# Patient Record
Sex: Male | Born: 1985 | Race: Black or African American | Hispanic: No | Marital: Single | Smoking: Former smoker
Health system: Southern US, Community
[De-identification: ages and names within clinical notes are randomized; demographics above are authoritative.]

## PROBLEM LIST (undated history)

## (undated) DIAGNOSIS — I10 Essential (primary) hypertension: Secondary | ICD-10-CM

## (undated) DIAGNOSIS — E119 Type 2 diabetes mellitus without complications: Secondary | ICD-10-CM

## (undated) DIAGNOSIS — K859 Acute pancreatitis without necrosis or infection, unspecified: Secondary | ICD-10-CM

---
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
## 2006-04-17 ENCOUNTER — Emergency Department (HOSPITAL_COMMUNITY): Admission: EM | Admit: 2006-04-17 | Discharge: 2006-04-17 | Payer: Self-pay | Admitting: Emergency Medicine

## 2006-05-06 ENCOUNTER — Emergency Department (HOSPITAL_COMMUNITY): Admission: EM | Admit: 2006-05-06 | Discharge: 2006-05-07 | Payer: Self-pay | Admitting: Emergency Medicine

## 2006-10-06 ENCOUNTER — Emergency Department (HOSPITAL_COMMUNITY): Admission: EM | Admit: 2006-10-06 | Discharge: 2006-10-06 | Payer: Self-pay | Admitting: Emergency Medicine

## 2006-10-11 ENCOUNTER — Emergency Department (HOSPITAL_COMMUNITY): Admission: EM | Admit: 2006-10-11 | Discharge: 2006-10-11 | Payer: Self-pay | Admitting: Emergency Medicine

## 2006-11-10 ENCOUNTER — Emergency Department (HOSPITAL_COMMUNITY): Admission: EM | Admit: 2006-11-10 | Discharge: 2006-11-10 | Payer: Self-pay | Admitting: Emergency Medicine

## 2006-11-11 ENCOUNTER — Emergency Department (HOSPITAL_COMMUNITY): Admission: EM | Admit: 2006-11-11 | Discharge: 2006-11-11 | Payer: Self-pay | Admitting: Emergency Medicine

## 2013-05-17 ENCOUNTER — Emergency Department (HOSPITAL_COMMUNITY)
Admission: EM | Admit: 2013-05-17 | Discharge: 2013-05-17 | Disposition: A | Discharge status: Home / Self Care | Payer: Self-pay | Attending: Emergency Medicine | Admitting: Emergency Medicine

## 2013-05-17 ENCOUNTER — Encounter (HOSPITAL_COMMUNITY): Payer: Self-pay | Admitting: Emergency Medicine

## 2013-05-17 DIAGNOSIS — Y939 Activity, unspecified: Secondary | ICD-10-CM | POA: Insufficient documentation

## 2013-05-17 DIAGNOSIS — Z23 Encounter for immunization: Secondary | ICD-10-CM | POA: Insufficient documentation

## 2013-05-17 DIAGNOSIS — S0181XA Laceration without foreign body of other part of head, initial encounter: Secondary | ICD-10-CM

## 2013-05-17 DIAGNOSIS — Y929 Unspecified place or not applicable: Secondary | ICD-10-CM | POA: Insufficient documentation

## 2013-05-17 DIAGNOSIS — X58XXXA Exposure to other specified factors, initial encounter: Secondary | ICD-10-CM | POA: Insufficient documentation

## 2013-05-17 DIAGNOSIS — F17200 Nicotine dependence, unspecified, uncomplicated: Secondary | ICD-10-CM | POA: Insufficient documentation

## 2013-05-17 DIAGNOSIS — S0190XA Unspecified open wound of unspecified part of head, initial encounter: Secondary | ICD-10-CM | POA: Insufficient documentation

## 2013-05-17 MED ORDER — TETANUS-DIPHTH-ACELL PERTUSSIS 5-2.5-18.5 LF-MCG/0.5 IM SUSP
0.5000 mL | Freq: Once | INTRAMUSCULAR | Status: AC
  Administered 2013-05-17: 0.5 mL via INTRAMUSCULAR
  Filled 2013-05-17: qty 0.5
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
## 2013-05-17 NOTE — ED Provider Notes (Signed)
Medical screening examination/treatment/procedure(s) were performed by non-physician practitioner and as supervising physician I was immediately available for consultation/collaboration.  Hurman Horn, MD 05/17/13 [phone]
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
## 2013-05-18 NOTE — ED Provider Notes (Signed)
Medical screening examination/treatment/procedure(s) were performed by non-physician practitioner and as supervising physician I was immediately available for consultation/collaboration.   Hurman Horn, MD 05/18/13 1910

## 2013-05-22 ENCOUNTER — Encounter (HOSPITAL_COMMUNITY): Payer: Self-pay | Admitting: *Deleted

## 2013-05-22 ENCOUNTER — Emergency Department (INDEPENDENT_AMBULATORY_CARE_PROVIDER_SITE_OTHER)
Admission: EM | Admit: 2013-05-22 | Discharge: 2013-05-22 | Disposition: A | Discharge status: Home / Self Care | Payer: Self-pay | Source: Home / Self Care

## 2013-05-22 DIAGNOSIS — Z5189 Encounter for other specified aftercare: Secondary | ICD-10-CM

## 2013-05-22 NOTE — ED Notes (Signed)
Pt  Here  For  Suture  Removal     The  Sutures  Have  Been in  For  5  Days       They  Appear  To be  Well  healing

## 2013-05-24 ENCOUNTER — Emergency Department (INDEPENDENT_AMBULATORY_CARE_PROVIDER_SITE_OTHER)
Admission: EM | Admit: 2013-05-24 | Discharge: 2013-05-24 | Disposition: A | Discharge status: Home / Self Care | Payer: Self-pay | Source: Home / Self Care | Attending: Family Medicine | Admitting: Family Medicine

## 2013-05-24 ENCOUNTER — Encounter (HOSPITAL_COMMUNITY): Payer: Self-pay | Admitting: Emergency Medicine

## 2013-05-24 DIAGNOSIS — Z5189 Encounter for other specified aftercare: Secondary | ICD-10-CM

## 2013-05-24 DIAGNOSIS — Z4802 Encounter for removal of sutures: Secondary | ICD-10-CM
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
## 2013-05-24 NOTE — ED Notes (Signed)
Pt is here to have sutures removed Seen at [facility] ED on 8/1... Pt voices no new concerns. Alert w/no signs of acute distress.

## 2013-05-28 NOTE — ED Provider Notes (Signed)
  CSN: [id number]     Arrival date & time 05/22/13  1105 History     First MD Initiated Contact with Patient 05/22/13 1204     Chief Complaint  Patient presents with  . Suture / Staple Removal   (Consider location/radiation/quality/duration/timing/severity/associated sxs/prior Treatment) HPI  27 yo bm presented for wound inspection and possible suture removal.  Suffered a head laceration about 5 days ago and this was closed in the ED.  Was advised to come in today for recheck.  States that he is doing well.  No drainage, headache, vision changes, fever.   History reviewed. No pertinent past medical history. History reviewed. No pertinent past surgical history. No family history on file. History  Substance Use Topics  . Smoking status: Current Every Day Smoker  . Smokeless tobacco: Not on file  . Alcohol Use: No    Review of Systems  Constitutional: Negative.   HENT: Negative.   Eyes: Negative.   Skin: Positive for wound.  Neurological: Negative.     Allergies  Review of patient's allergies indicates no known allergies.  Home Medications  No current outpatient prescriptions on file. BP 138/101  Pulse 85  Temp(Src) 98.7 F (37.1 C) (Oral)  Resp 18  SpO2 96% Physical Exam  Constitutional: He appears well-developed and well-nourished. No distress.  HENT:  Head: Normocephalic.  Nose: Nose normal.  Forehead sutures intact. Wound healing well but not complete.  No drainage or signs of infection.   Eyes: EOM are normal.    ED Course   Procedures (including critical care time)  Labs Reviewed - No data to display No results found. 1. Visit for wound check     MDM  Advised to come back in a couple of days to give wound a little more healing time.  Voices understanding.  All questions answered.   Zonia Kief, PA-C 05/28/13 1615

## 2013-05-30 NOTE — ED Provider Notes (Signed)
Medical screening examination/treatment/procedure(s) were performed by a resident physician or non-physician practitioner and as the supervising physician I was immediately available for consultation/collaboration.  Clementeen Runkles, MD   Rodolph Bong, MD 05/30/13 [phone]

## 2013-10-08 ENCOUNTER — Emergency Department (HOSPITAL_COMMUNITY)
Admission: EM | Admit: 2013-10-08 | Discharge: 2013-10-08 | Disposition: A | Discharge status: Home / Self Care | Payer: Self-pay | Attending: Emergency Medicine | Admitting: Emergency Medicine

## 2013-10-08 ENCOUNTER — Encounter (HOSPITAL_COMMUNITY): Payer: Self-pay | Admitting: Emergency Medicine

## 2013-10-08 DIAGNOSIS — Z711 Person with feared health complaint in whom no diagnosis is made: Secondary | ICD-10-CM

## 2013-10-08 DIAGNOSIS — R110 Nausea: Secondary | ICD-10-CM | POA: Insufficient documentation

## 2013-10-08 DIAGNOSIS — F17200 Nicotine dependence, unspecified, uncomplicated: Secondary | ICD-10-CM | POA: Insufficient documentation

## 2013-10-08 DIAGNOSIS — R300 Dysuria: Secondary | ICD-10-CM | POA: Insufficient documentation

## 2013-10-08 DIAGNOSIS — K625 Hemorrhage of anus and rectum: Secondary | ICD-10-CM | POA: Insufficient documentation

## 2013-10-08 DIAGNOSIS — R109 Unspecified abdominal pain: Secondary | ICD-10-CM | POA: Insufficient documentation

## 2013-10-08 LAB — CBC
HCT: 45.7 % (ref 39.0–52.0)
Hemoglobin: 15.9 g/dL (ref 13.0–17.0)
MCHC: 34.8 g/dL (ref 30.0–36.0)
MCV: 93.6 fL (ref 78.0–100.0)
Platelets: 284 10*3/uL (ref 150–400)
RDW: 13.8 % (ref 11.5–15.5)

## 2013-10-08 LAB — COMPREHENSIVE METABOLIC PANEL
BUN: 18 mg/dL (ref 6–23)
CO2: 26 mEq/L (ref 19–32)
Chloride: 103 mEq/L (ref 96–112)
GFR calc Af Amer: >90 mL/min (ref >90)
Potassium: 4.8 mEq/L (ref 3.5–5.1)

## 2013-10-08 LAB — URINALYSIS, ROUTINE W REFLEX MICROSCOPIC
Bilirubin Urine: NEGATIVE
Ketones, ur: NEGATIVE mg/dL
Specific Gravity, Urine: 1.023 (ref 1.005–1.030)
Urobilinogen, UA: 0.2 mg/dL (ref 0.0–1.0)
pH: 6.5 (ref 5.0–8.0)

## 2013-10-08 LAB — URINE MICROSCOPIC-ADD ON

## 2013-10-08 MED ORDER — DICYCLOMINE HCL 10 MG PO CAPS: 10.0000 mg | ORAL_CAPSULE | Freq: Three times a day (TID) | ORAL | Status: DC

## 2013-10-08 MED ORDER — ONDANSETRON 4 MG PO TBDP
8.0000 mg | ORAL_TABLET | Freq: Once | ORAL | Status: AC
  Administered 2013-10-08: 8 mg via ORAL
  Filled 2013-10-08: qty 2

## 2013-10-08 MED ORDER — CEFTRIAXONE SODIUM 250 MG IJ SOLR
250.0000 mg | Freq: Once | INTRAMUSCULAR | Status: AC
  Administered 2013-10-08: 250 mg via INTRAMUSCULAR
  Filled 2013-10-08: qty 250

## 2013-10-08 MED ORDER — DICYCLOMINE HCL 10 MG PO CAPS
10.0000 mg | ORAL_CAPSULE | Freq: Once | ORAL | Status: AC
  Administered 2013-10-08: 10 mg via ORAL
  Filled 2013-10-08: qty 1

## 2013-10-08 MED ORDER — LIDOCAINE HCL (PF) 1 % IJ SOLN
INTRAMUSCULAR | Status: AC
  Administered 2013-10-08: 5 mL
  Filled 2013-10-08: qty 5

## 2013-10-08 MED ORDER — AZITHROMYCIN 1 G PO PACK
1.0000 g | PACK | Freq: Once | ORAL | Status: AC
  Administered 2013-10-08: 1 g via ORAL
  Filled 2013-10-08: qty 1

## 2013-10-08 NOTE — ED Notes (Signed)
Pt reports lower abdominal cramping this am and had BM with blood in it

## 2013-10-08 NOTE — ED Provider Notes (Signed)
CSN: [id number]     Arrival date & time 10/08/13  6213 History   First MD Initiated Contact with Patient 10/08/13 1113     Chief Complaint  Patient presents with  . Abdominal Pain  . Rectal Bleeding   (Consider location/radiation/quality/duration/timing/severity/associated sxs/prior Treatment) HPI Comments: Patient is a 27 year old male presented to emergency department for one day of waxing and waning lower abdominal cramping pain w/o radiation w/ one associated loose BM with bright red blood on the toilet paper. The patient states the abdominal pain began last evening around 2AM, but he was able to go to bed. He states the pain was worse this morning when he woke up. He states "curling up in a ball" alleviates pain while he cannot define any precipitating factors. Patient denies any vomiting, but does endorse some nausea. He denies any fevers, testicular or penile pain or swelling, penile discharge. Patient states he has had some dysuria for the last few days and is concerned he may have a sexually transmitted disease. Denies any abdominal surgical history. Does endorse having 2-3 alcoholic beverages last evening. Denies any RD use.   The history is provided by the patient.    History reviewed. No pertinent past medical history. History reviewed. No pertinent past surgical history. No family history on file. History  Substance Use Topics  . Smoking status: Current Every Day Smoker  . Smokeless tobacco: Not on file  . Alcohol Use: Yes     Comment: occ    Review of Systems  Constitutional: Negative for fever and chills.  Respiratory: Negative for shortness of breath.   Cardiovascular: Negative for chest pain.  Gastrointestinal: Positive for nausea, abdominal pain and blood in stool. Negative for vomiting, diarrhea, constipation, abdominal distention, anal bleeding and rectal pain.  Genitourinary: Positive for dysuria. Negative for urgency, decreased urine volume, discharge, penile  swelling, scrotal swelling, penile pain and testicular pain.  Musculoskeletal: Negative for back pain.  All other systems reviewed and are negative.    Allergies  Review of patient's allergies indicates no known allergies.  Home Medications   Current Outpatient Rx  Name  Route  Sig  Dispense  Refill  . dicyclomine (BENTYL) 10 MG capsule   Oral   Take 1 capsule (10 mg total) by mouth 3 (three) times daily before meals.   21 capsule   0    BP 124/78  Pulse 85  Temp(Src) 98 F (36.7 C) (Oral)  Resp 20  Wt 185 lb (83.915 kg)  SpO2 100% Physical Exam  Constitutional: He is oriented to person, place, and time. He appears well-developed and well-nourished. No distress.  HENT:  Head: Normocephalic and atraumatic.  Right Ear: External ear normal.  Left Ear: External ear normal.  Nose: Nose normal.  Mouth/Throat: Oropharynx is clear and moist. No oropharyngeal exudate.  Eyes: Conjunctivae are normal.  Neck: Neck supple.  Cardiovascular: Normal rate, regular rhythm and normal heart sounds.   Pulmonary/Chest: Effort normal and breath sounds normal. No respiratory distress.  Abdominal: Soft. Bowel sounds are normal. He exhibits no distension and no mass. There is tenderness in the right lower quadrant, suprapubic area and left lower quadrant. There is no rigidity, no rebound, no guarding and no CVA tenderness.  Genitourinary: Rectum normal, testes normal and penis normal. Rectal exam shows no external hemorrhoid, no internal hemorrhoid, no fissure, no mass, no tenderness and anal tone normal. Guaiac negative stool. Right testis shows no tenderness. Left testis shows no tenderness. No penile tenderness.  Musculoskeletal:  He exhibits no edema.  Lymphadenopathy:       Right: No inguinal adenopathy present.       Left: No inguinal adenopathy present.  Neurological: He is alert and oriented to person, place, and time.  Skin: Skin is warm and dry. He is not diaphoretic.    ED Course   Procedures (including critical care time) Medications  ondansetron (ZOFRAN-ODT) disintegrating tablet 8 mg (8 mg Oral Given 10/08/13 1223)  dicyclomine (BENTYL) capsule 10 mg (10 mg Oral Given 10/08/13 1223)  azithromycin (ZITHROMAX) powder 1 g (1 g Oral Given 10/08/13 1343)  cefTRIAXone (ROCEPHIN) injection 250 mg (250 mg Intramuscular Given 10/08/13 1345)  lidocaine (PF) (XYLOCAINE) 1 % injection (5 mLs  Given 10/08/13 1345)   Labs Review Labs Reviewed  COMPREHENSIVE METABOLIC PANEL - Abnormal; Notable for the following:    AST 43 (*)    ALT 63 (*)    All other components within normal limits  URINALYSIS, ROUTINE W REFLEX MICROSCOPIC - Abnormal; Notable for the following:    Hgb urine dipstick TRACE (*)    All other components within normal limits  GC/CHLAMYDIA PROBE AMP  CBC  URINE MICROSCOPIC-ADD ON  OCCULT BLOOD, POC DEVICE   Imaging Review No results found.  EKG Interpretation   None       MDM   1. Abdominal pain   2. Concern about STD in male without diagnosis    Afebrile, NAD, non-toxic appearing, AAOx4.   1) Abdominal pain: Patient is nontoxic, nonseptic appearing, in no apparent distress. Abdomen soft, mildly tender on examination, w/o guarding, rebound, or rigidity. Patient's pain and other symptoms adequately managed in emergency department.  Fluids tolerated in ED.  Labs and vitals reviewed.  Patient does not meet the SIRS or Sepsis criteria.  On repeat exam patient does not have a surgical abdomen and there are nor peritoneal signs.  No indication of appendicitis, bowel obstruction, bowel perforation, cholecystitis, diverticulitis.  Patient discharged home with symptomatic treatment and given strict instructions for follow-up with their primary care physician.    2) STD: Patient to be discharged with instructions to follow up with PCP. Pt understands GC/Chlamydia cultures pending and that they will need to inform all sexual partners within the last 6 months if  results return positive. Pt has been treated prophylacticly with azithromycin and rocephin due to pts history. Pt advised that she will receive a call in 48 hours if the test is positive and to refrain from sexual activity for 48 hours. If the test is positive, pt is advised to refrain from sexual activity for 10 days for the medicine to take effect.  Genital examination wnl.  Discussed that because pt has had recent unprotected sex, might want to consider getting tested for HIV as well. Counseled pt that latex condoms are the only way to prevent against STDs or HIV.  I have also discussed reasons to return immediately to the ER.  Patient expresses understanding and agrees with plan. Patient is stable at time of discharge. Patient d/w with Dr. Loretha Stapler, agrees with plan.           Jeannetta Ellis, PA-C 10/08/13 1947

## 2013-10-09 LAB — GC/CHLAMYDIA PROBE AMP
CT Probe RNA: NEGATIVE
GC Probe RNA: NEGATIVE

## 2013-10-11 NOTE — ED Provider Notes (Signed)
Medical screening examination/treatment/procedure(s) were performed by non-physician practitioner and as supervising physician I was immediately available for consultation/collaboration.  EKG Interpretation   None         Candyce Churn, MD 10/11/13 [phone]

## 2016-02-06 ENCOUNTER — Emergency Department (HOSPITAL_COMMUNITY)
Admission: EM | Admit: 2016-02-06 | Discharge: 2016-02-06 | Disposition: A | Discharge status: Home / Self Care | Payer: No Typology Code available for payment source | Attending: Emergency Medicine | Admitting: Emergency Medicine

## 2016-02-06 ENCOUNTER — Encounter (HOSPITAL_COMMUNITY): Payer: Self-pay | Admitting: Family Medicine

## 2016-02-06 ENCOUNTER — Emergency Department (HOSPITAL_COMMUNITY): Payer: No Typology Code available for payment source

## 2016-02-06 DIAGNOSIS — M545 Low back pain, unspecified: Secondary | ICD-10-CM

## 2016-02-06 DIAGNOSIS — Z79899 Other long term (current) drug therapy: Secondary | ICD-10-CM | POA: Diagnosis not present

## 2016-02-06 DIAGNOSIS — Y92410 Unspecified street and highway as the place of occurrence of the external cause: Secondary | ICD-10-CM | POA: Insufficient documentation

## 2016-02-06 DIAGNOSIS — Y998 Other external cause status: Secondary | ICD-10-CM | POA: Insufficient documentation

## 2016-02-06 DIAGNOSIS — S76212A Strain of adductor muscle, fascia and tendon of left thigh, initial encounter: Secondary | ICD-10-CM | POA: Diagnosis not present

## 2016-02-06 DIAGNOSIS — S3992XA Unspecified injury of lower back, initial encounter: Secondary | ICD-10-CM | POA: Diagnosis not present

## 2016-02-06 DIAGNOSIS — F17200 Nicotine dependence, unspecified, uncomplicated: Secondary | ICD-10-CM | POA: Diagnosis not present

## 2016-02-06 DIAGNOSIS — Y9389 Activity, other specified: Secondary | ICD-10-CM | POA: Diagnosis not present

## 2016-02-06 DIAGNOSIS — S76219A Strain of adductor muscle, fascia and tendon of unspecified thigh, initial encounter: Secondary | ICD-10-CM

## 2016-02-06 DIAGNOSIS — Z791 Long term (current) use of non-steroidal anti-inflammatories (NSAID): Secondary | ICD-10-CM | POA: Diagnosis not present

## 2016-02-06 DIAGNOSIS — S199XXA Unspecified injury of neck, initial encounter: Secondary | ICD-10-CM | POA: Diagnosis not present

## 2016-02-06 MED ORDER — NAPROXEN 250 MG PO TABS
500.0000 mg | ORAL_TABLET | Freq: Once | ORAL | Status: AC
  Administered 2016-02-06: 500 mg via ORAL
  Filled 2016-02-06: qty 2

## 2016-02-06 MED ORDER — NAPROXEN 500 MG PO TABS
500.0000 mg | ORAL_TABLET | Freq: Two times a day (BID) | ORAL | Status: DC
Start: 2016-02-06 — End: 2019-04-10

## 2016-02-06 MED ORDER — METHOCARBAMOL 500 MG PO TABS: 500.0000 mg | ORAL_TABLET | Freq: Two times a day (BID) | ORAL | Status: DC | PRN
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
## 2016-02-06 NOTE — ED Notes (Signed)
Declined W/C at D/C and was escorted to lobby by RN. 
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
## 2017-11-10 ENCOUNTER — Encounter (HOSPITAL_COMMUNITY): Payer: Self-pay | Admitting: Emergency Medicine

## 2017-11-10 ENCOUNTER — Emergency Department (HOSPITAL_COMMUNITY)
Admission: EM | Admit: 2017-11-10 | Discharge: 2017-11-10 | Disposition: A | Discharge status: Home / Self Care | Payer: Self-pay | Attending: Emergency Medicine | Admitting: Emergency Medicine

## 2017-11-10 DIAGNOSIS — R112 Nausea with vomiting, unspecified: Secondary | ICD-10-CM | POA: Insufficient documentation

## 2017-11-10 DIAGNOSIS — R6883 Chills (without fever): Secondary | ICD-10-CM | POA: Insufficient documentation

## 2017-11-10 DIAGNOSIS — M7918 Myalgia, other site: Secondary | ICD-10-CM | POA: Insufficient documentation

## 2017-11-10 DIAGNOSIS — Z87891 Personal history of nicotine dependence: Secondary | ICD-10-CM | POA: Insufficient documentation

## 2017-11-10 DIAGNOSIS — R197 Diarrhea, unspecified: Secondary | ICD-10-CM | POA: Insufficient documentation

## 2017-11-10 LAB — URINALYSIS, ROUTINE W REFLEX MICROSCOPIC
Bilirubin Urine: NEGATIVE
Glucose, UA: NEGATIVE mg/dL
HGB URINE DIPSTICK: NEGATIVE
KETONES UR: NEGATIVE mg/dL
Leukocytes, UA: NEGATIVE
Nitrite: NEGATIVE
PH: 6 (ref 5.0–8.0)
Protein, ur: NEGATIVE mg/dL
Specific Gravity, Urine: 1.01 (ref 1.005–1.030)

## 2017-11-10 LAB — COMPREHENSIVE METABOLIC PANEL
ALBUMIN: 4.2 g/dL (ref 3.5–5.0)
ALK PHOS: 58 U/L (ref 38–126)
ALT: 40 U/L (ref 17–63)
ANION GAP: 13 (ref 5–15)
AST: 31 U/L (ref 15–41)
BILIRUBIN TOTAL: 0.6 mg/dL (ref 0.3–1.2)
BUN: 12 mg/dL (ref 6–20)
CALCIUM: 9.6 mg/dL (ref 8.9–10.3)
CO2: 22 mmol/L (ref 22–32)
Chloride: 101 mmol/L (ref 101–111)
Creatinine, Ser: 1.06 mg/dL (ref 0.61–1.24)
GFR calc Af Amer: >60 mL/min (ref >60)
GFR calc non Af Amer: >60 mL/min (ref >60)
GLUCOSE: 108 mg/dL — AB (ref 65–99)
Potassium: 4.5 mmol/L (ref 3.5–5.1)
SODIUM: 136 mmol/L (ref 135–145)
Total Protein: 7.8 g/dL (ref 6.5–8.1)

## 2017-11-10 LAB — LIPASE, BLOOD: Lipase: 31 U/L (ref 11–51)

## 2017-11-10 LAB — CBC
HCT: 46.5 % (ref 39.0–52.0)
HEMOGLOBIN: 15.7 g/dL (ref 13.0–17.0)
MCH: 31 pg (ref 26.0–34.0)
MCHC: 33.8 g/dL (ref 30.0–36.0)
MCV: 91.9 fL (ref 78.0–100.0)
Platelets: 310 10*3/uL (ref 150–400)
RBC: 5.06 MIL/uL (ref 4.22–5.81)
RDW: 14 % (ref 11.5–15.5)
WBC: 7.7 10*3/uL (ref 4.0–10.5)

## 2017-11-10 LAB — INFLUENZA PANEL BY PCR (TYPE A & B)
Influenza A By PCR: NEGATIVE
Influenza B By PCR: NEGATIVE

## 2017-11-10 MED ORDER — DICYCLOMINE HCL 10 MG PO CAPS: 10.0000 mg | ORAL_CAPSULE | Freq: Three times a day (TID) | ORAL | 0 refills | Status: DC

## 2017-11-10 MED ORDER — ONDANSETRON HCL 4 MG PO TABS: 4.0000 mg | ORAL_TABLET | Freq: Three times a day (TID) | ORAL | 0 refills | Status: DC | PRN

## 2017-11-10 NOTE — ED Triage Notes (Signed)
Pt reports diarrhea and vomiting x3 days. Dry cough. Denies fevers
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
## 2017-12-19 ENCOUNTER — Ambulatory Visit (HOSPITAL_COMMUNITY)
Admission: EM | Admit: 2017-12-19 | Discharge: 2017-12-19 | Disposition: A | Discharge status: Home / Self Care | Payer: Self-pay | Attending: Family Medicine | Admitting: Family Medicine

## 2017-12-19 ENCOUNTER — Encounter (HOSPITAL_COMMUNITY): Payer: Self-pay | Admitting: Emergency Medicine

## 2017-12-19 DIAGNOSIS — Z202 Contact with and (suspected) exposure to infections with a predominantly sexual mode of transmission: Secondary | ICD-10-CM | POA: Insufficient documentation

## 2017-12-19 DIAGNOSIS — Z87891 Personal history of nicotine dependence: Secondary | ICD-10-CM | POA: Insufficient documentation

## 2017-12-19 DIAGNOSIS — A630 Anogenital (venereal) warts: Secondary | ICD-10-CM | POA: Insufficient documentation

## 2017-12-19 DIAGNOSIS — Z113 Encounter for screening for infections with a predominantly sexual mode of transmission: Secondary | ICD-10-CM

## 2017-12-19 DIAGNOSIS — Z79899 Other long term (current) drug therapy: Secondary | ICD-10-CM | POA: Insufficient documentation

## 2017-12-19 MED ORDER — AZITHROMYCIN 250 MG PO TABS
ORAL_TABLET | ORAL | Status: AC
  Filled 2017-12-19: qty 4

## 2017-12-19 MED ORDER — CEFTRIAXONE SODIUM 250 MG IJ SOLR
INTRAMUSCULAR | Status: AC
Start: 2017-12-19 — End: 2017-12-19
  Filled 2017-12-19: qty 250

## 2017-12-19 MED ORDER — CEFTRIAXONE SODIUM 250 MG IJ SOLR
250.0000 mg | Freq: Once | INTRAMUSCULAR | Status: AC
  Administered 2017-12-19: 250 mg via INTRAMUSCULAR

## 2017-12-19 MED ORDER — STERILE WATER FOR INJECTION IJ SOLN
INTRAMUSCULAR | Status: AC
  Filled 2017-12-19: qty 10

## 2017-12-19 MED ORDER — AZITHROMYCIN 250 MG PO TABS
1000.0000 mg | ORAL_TABLET | Freq: Once | ORAL | Status: AC
  Administered 2017-12-19: 1000 mg via ORAL
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
## 2017-12-19 NOTE — ED Provider Notes (Signed)
[facility]    CSN: [id number] Arrival date & time: 12/19/17  1017     History   Chief Complaint Chief Complaint  Patient presents with  . Exposure to STD    HPI Edward Ware is a 32 y.o. male.   Noticed a new bump on his distal penile shaft yesterday and would like STD testing. Would like STD treatments as well. Denies positive exposure but does have multiple sexual partners. No penile discharge, dysuria, testicular pain or lesion.        History reviewed. No pertinent past medical history.  There are no active problems to display for this patient.   History reviewed. No pertinent surgical history.     Home Medications    Prior to Admission medications   Medication Sig Start Date End Date Taking? Authorizing Provider  dicyclomine (BENTYL) 10 MG capsule Take 1 capsule (10 mg total) by mouth 3 (three) times daily before meals. 11/10/17   Fayrene Helper, PA-C  methocarbamol (ROBAXIN) 500 MG tablet Take 1 tablet (500 mg total) by mouth 2 (two) times daily as needed for muscle spasms. 02/06/16   Ward, Chase Picket, PA-C  naproxen (NAPROSYN) 500 MG tablet Take 1 tablet (500 mg total) by mouth 2 (two) times daily. 02/06/16   Ward, Chase Picket, PA-C  ondansetron (ZOFRAN) 4 MG tablet Take 1 tablet (4 mg total) by mouth every 8 (eight) hours as needed for nausea or vomiting. 11/10/17   Fayrene Helper, PA-C    Family History History reviewed. No pertinent family history.  Social History Social History   Tobacco Use  . Smoking status: Former Games developer  . Smokeless tobacco: Never Used  Substance Use Topics  . Alcohol use: Yes    Comment: occ  . Drug use: No     Allergies   Patient has no known allergies.   Review of Systems Review of Systems  Constitutional:       See HPI     Physical Exam Triage Vital Signs ED Triage Vitals  Enc Vitals Group     BP 12/19/17 1056 (!) 145/85     Pulse Rate 12/19/17 1056 (!) 103     Resp 12/19/17 1056 18   Temp 12/19/17 1056 97.6 F (36.4 C)     Temp Source 12/19/17 1056 Oral     SpO2 12/19/17 1056 97 %     Weight --      Height --      Head Circumference --      Peak Flow --      Pain Score 12/19/17 1057 3     Pain Loc --      Pain Edu? --      Excl. in GC? --    No data found.  Updated Vital Signs BP (!) 145/85 (BP Location: Right Arm)   Pulse (!) 103   Temp 97.6 F (36.4 C) (Oral)   Resp 18   SpO2 97%   Visual Acuity Right Eye Distance:   Left Eye Distance:   Bilateral Distance:    Right Eye Near:   Left Eye Near:    Bilateral Near:     Physical Exam  Constitutional: He is oriented to person, place, and time. He appears well-developed and well-nourished.  Cardiovascular: Normal rate.  Pulmonary/Chest: Effort normal.  Genitourinary:  Genitourinary Comments: Circumcised, has soft elevated clustered and single papules of different size and shape on the distal shaft of penis. Some appears to be cauliflower-like appearance. No erosion  noted. No discharge noted. Testicles unremarkable.   Neurological: He is alert and oriented to person, place, and time.  Nursing note and vitals reviewed.    UC Treatments / Results  Labs (all labs ordered are listed, but only abnormal results are displayed) Labs Reviewed  URINE CYTOLOGY ANCILLARY ONLY    EKG  EKG Interpretation None       Radiology No results found.  Procedures Procedures (including critical care time)  Medications Ordered in UC Medications  azithromycin (ZITHROMAX) tablet 1,000 mg (not administered)  cefTRIAXone (ROCEPHIN) injection 250 mg (not administered)     Initial Impression / Assessment and Plan / UC Course  I have reviewed the triage vital signs and the nursing notes.  Pertinent labs & imaging results that were available during my care of the patient were reviewed by me and considered in my medical decision making (see chart for details).    Final Clinical Impressions(s) / UC Diagnoses     Final diagnoses:  Genital warts  Screen for STD (sexually transmitted disease)   Lesions on the penile shaft classic for genital warts. Advised to get in with a PCP or Health Department to seek treatment.   Urine cytology obtained today for STD testing. Treated empirically with azithromycin and rocephin.     ED Discharge Orders    None     Controlled Substance Prescriptions [place] Controlled Substance Registry consulted? No   Lucia EstelleZheng, Usman Millett, NP 12/19/17 1119

## 2017-12-20 LAB — URINE CYTOLOGY ANCILLARY ONLY
Chlamydia: NEGATIVE
Neisseria Gonorrhea: NEGATIVE
Trichomonas: NEGATIVE

## 2019-04-09 ENCOUNTER — Other Ambulatory Visit: Payer: Self-pay

## 2019-04-09 ENCOUNTER — Emergency Department (HOSPITAL_COMMUNITY)
Admission: EM | Admit: 2019-04-09 | Discharge: 2019-04-10 | Disposition: A | Discharge status: Home / Self Care | Payer: No Typology Code available for payment source | Attending: Emergency Medicine | Admitting: Emergency Medicine

## 2019-04-09 ENCOUNTER — Emergency Department (HOSPITAL_COMMUNITY): Payer: No Typology Code available for payment source

## 2019-04-09 DIAGNOSIS — Z87891 Personal history of nicotine dependence: Secondary | ICD-10-CM | POA: Diagnosis not present

## 2019-04-09 DIAGNOSIS — M25561 Pain in right knee: Secondary | ICD-10-CM | POA: Diagnosis present
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
## 2019-04-10 MED ORDER — NAPROXEN 500 MG PO TABS: 500.0000 mg | ORAL_TABLET | Freq: Two times a day (BID) | ORAL | 0 refills | Status: DC

## 2019-04-10 MED ORDER — TRAMADOL HCL 50 MG PO TABS
50.0000 mg | ORAL_TABLET | Freq: Once | ORAL | Status: AC
  Administered 2019-04-10: 01:00:00 50 mg via ORAL
  Filled 2019-04-10: qty 1

## 2019-04-10 MED ORDER — NAPROXEN 500 MG PO TABS
500.0000 mg | ORAL_TABLET | Freq: Once | ORAL | Status: AC
  Administered 2019-04-10: 500 mg via ORAL
  Filled 2019-04-10: qty 1
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
## 2019-04-10 NOTE — Discharge Instructions (Addendum)
Apply ice to areas of injury 3-4 times per day to limit inflammation.  We recommend consistent use of naproxen for pain.  We recommend follow-up with an Orthopedist or a primary care doctor to ensure resolution of symptoms.  Return to the ED for any new or concerning symptoms.
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
## 2021-06-03 ENCOUNTER — Observation Stay (HOSPITAL_COMMUNITY): Payer: Self-pay

## 2021-06-03 ENCOUNTER — Inpatient Hospital Stay (HOSPITAL_COMMUNITY)
Admission: EM | Admit: 2021-06-03 | Discharge: 2021-06-08 | DRG: 637 | Disposition: A | Discharge status: Home / Self Care | Payer: Self-pay | Attending: Family Medicine | Admitting: Family Medicine

## 2021-06-03 ENCOUNTER — Encounter (HOSPITAL_COMMUNITY): Payer: Self-pay

## 2021-06-03 ENCOUNTER — Other Ambulatory Visit: Payer: Self-pay

## 2021-06-03 DIAGNOSIS — R109 Unspecified abdominal pain: Secondary | ICD-10-CM

## 2021-06-03 DIAGNOSIS — Z20822 Contact with and (suspected) exposure to covid-19: Secondary | ICD-10-CM | POA: Diagnosis present

## 2021-06-03 DIAGNOSIS — F1010 Alcohol abuse, uncomplicated: Secondary | ICD-10-CM

## 2021-06-03 DIAGNOSIS — F10239 Alcohol dependence with withdrawal, unspecified: Secondary | ICD-10-CM | POA: Diagnosis present

## 2021-06-03 DIAGNOSIS — E785 Hyperlipidemia, unspecified: Secondary | ICD-10-CM | POA: Diagnosis present

## 2021-06-03 DIAGNOSIS — Z833 Family history of diabetes mellitus: Secondary | ICD-10-CM

## 2021-06-03 DIAGNOSIS — Z79899 Other long term (current) drug therapy: Secondary | ICD-10-CM

## 2021-06-03 DIAGNOSIS — Z6832 Body mass index (BMI) 32.0-32.9, adult: Secondary | ICD-10-CM

## 2021-06-03 DIAGNOSIS — E860 Dehydration: Secondary | ICD-10-CM

## 2021-06-03 DIAGNOSIS — K859 Acute pancreatitis without necrosis or infection, unspecified: Secondary | ICD-10-CM | POA: Diagnosis present

## 2021-06-03 DIAGNOSIS — K852 Alcohol induced acute pancreatitis without necrosis or infection: Secondary | ICD-10-CM

## 2021-06-03 DIAGNOSIS — Z720 Tobacco use: Secondary | ICD-10-CM

## 2021-06-03 DIAGNOSIS — Z28310 Unvaccinated for covid-19: Secondary | ICD-10-CM

## 2021-06-03 DIAGNOSIS — I10 Essential (primary) hypertension: Secondary | ICD-10-CM | POA: Diagnosis present

## 2021-06-03 DIAGNOSIS — E1110 Type 2 diabetes mellitus with ketoacidosis without coma: Principal | ICD-10-CM

## 2021-06-03 LAB — URINALYSIS, ROUTINE W REFLEX MICROSCOPIC
Bilirubin Urine: NEGATIVE
Glucose, UA: >=500 mg/dL — AB
Ketones, ur: >80 mg/dL — AB
Leukocytes,Ua: NEGATIVE
Nitrite: NEGATIVE
Protein, ur: NEGATIVE mg/dL
Specific Gravity, Urine: 1.01 (ref 1.005–1.030)
pH: 5.5 (ref 5.0–8.0)

## 2021-06-03 LAB — CBG MONITORING, ED
Glucose-Capillary: 306 mg/dL — ABNORMAL HIGH (ref 70–99)
Glucose-Capillary: 406 mg/dL — ABNORMAL HIGH (ref 70–99)
Glucose-Capillary: 411 mg/dL — ABNORMAL HIGH (ref 70–99)
Glucose-Capillary: 542 mg/dL (ref 70–99)
Glucose-Capillary: >600 mg/dL (ref 70–99)

## 2021-06-03 LAB — CBC WITH DIFFERENTIAL/PLATELET
Abs Immature Granulocytes: 0.04 10*3/uL (ref 0.00–0.07)
Basophils Absolute: 0 10*3/uL (ref 0.0–0.1)
Basophils Relative: 0 %
Eosinophils Absolute: 0.1 10*3/uL (ref 0.0–0.5)
Eosinophils Relative: 1 %
HCT: 51.9 % (ref 39.0–52.0)
Hemoglobin: 17.7 g/dL — ABNORMAL HIGH (ref 13.0–17.0)
Immature Granulocytes: 1 %
Lymphocytes Relative: 13 %
Lymphs Abs: 1.1 10*3/uL (ref 0.7–4.0)
MCH: 30.3 pg (ref 26.0–34.0)
MCHC: 34.1 g/dL (ref 30.0–36.0)
MCV: 88.9 fL (ref 80.0–100.0)
Monocytes Absolute: 0.7 10*3/uL (ref 0.1–1.0)
Monocytes Relative: 9 %
Neutro Abs: 6.3 10*3/uL (ref 1.7–7.7)
Neutrophils Relative %: 76 %
Platelets: 265 10*3/uL (ref 150–400)
RBC: 5.84 MIL/uL — ABNORMAL HIGH (ref 4.22–5.81)
RDW: 13.1 % (ref 11.5–15.5)
WBC: 8.2 10*3/uL (ref 4.0–10.5)
nRBC: 0 % (ref 0.0–0.2)

## 2021-06-03 LAB — I-STAT CHEM 8, ED
BUN: 21 mg/dL — ABNORMAL HIGH (ref 6–20)
Calcium, Ion: 1.12 mmol/L — ABNORMAL LOW (ref 1.15–1.40)
Chloride: 91 mmol/L — ABNORMAL LOW (ref 98–111)
Creatinine, Ser: 1.1 mg/dL (ref 0.61–1.24)
Glucose, Bld: >700 mg/dL (ref 70–99)
HCT: 56 % — ABNORMAL HIGH (ref 39.0–52.0)
Hemoglobin: 19 g/dL — ABNORMAL HIGH (ref 13.0–17.0)
Potassium: 5.2 mmol/L — ABNORMAL HIGH (ref 3.5–5.1)
Sodium: 126 mmol/L — ABNORMAL LOW (ref 135–145)
TCO2: 22 mmol/L (ref 22–32)

## 2021-06-03 LAB — URINALYSIS, MICROSCOPIC (REFLEX)
Bacteria, UA: NONE SEEN
Squamous Epithelial / HPF: NONE SEEN (ref 0–5)

## 2021-06-03 LAB — COMPREHENSIVE METABOLIC PANEL
ALT: 52 U/L — ABNORMAL HIGH (ref 0–44)
AST: 35 U/L (ref 15–41)
Albumin: 4.8 g/dL (ref 3.5–5.0)
Alkaline Phosphatase: 82 U/L (ref 38–126)
Anion gap: 21 — ABNORMAL HIGH (ref 5–15)
BUN: 18 mg/dL (ref 6–20)
CO2: 21 mmol/L — ABNORMAL LOW (ref 22–32)
Calcium: 9.8 mg/dL (ref 8.9–10.3)
Chloride: 88 mmol/L — ABNORMAL LOW (ref 98–111)
Creatinine, Ser: 1.36 mg/dL — ABNORMAL HIGH (ref 0.61–1.24)
GFR, Estimated: >60 mL/min (ref >60)
Glucose, Bld: 698 mg/dL (ref 70–99)
Potassium: 5.2 mmol/L — ABNORMAL HIGH (ref 3.5–5.1)
Sodium: 130 mmol/L — ABNORMAL LOW (ref 135–145)
Total Bilirubin: 1.4 mg/dL — ABNORMAL HIGH (ref 0.3–1.2)
Total Protein: 9 g/dL — ABNORMAL HIGH (ref 6.5–8.1)

## 2021-06-03 LAB — BASIC METABOLIC PANEL
Anion gap: 20 — ABNORMAL HIGH (ref 5–15)
BUN: 16 mg/dL (ref 6–20)
CO2: 16 mmol/L — ABNORMAL LOW (ref 22–32)
Calcium: 9.7 mg/dL (ref 8.9–10.3)
Chloride: 94 mmol/L — ABNORMAL LOW (ref 98–111)
Creatinine, Ser: 1.27 mg/dL — ABNORMAL HIGH (ref 0.61–1.24)
GFR, Estimated: >60 mL/min (ref >60)
Glucose, Bld: 383 mg/dL — ABNORMAL HIGH (ref 70–99)
Potassium: 4.9 mmol/L (ref 3.5–5.1)
Sodium: 130 mmol/L — ABNORMAL LOW (ref 135–145)

## 2021-06-03 LAB — MAGNESIUM: Magnesium: 2.5 mg/dL — ABNORMAL HIGH (ref 1.7–2.4)

## 2021-06-03 LAB — CK: Total CK: 234 U/L (ref 49–397)

## 2021-06-03 LAB — BLOOD GAS, VENOUS
Acid-base deficit: 5.6 mmol/L — ABNORMAL HIGH (ref 0.0–2.0)
Bicarbonate: 20.6 mmol/L (ref 20.0–28.0)
O2 Saturation: 80.8 %
Patient temperature: 98.6
pCO2, Ven: 44.2 mmHg (ref 44.0–60.0)
pH, Ven: 7.291 (ref 7.250–7.430)
pO2, Ven: 53.6 mmHg — ABNORMAL HIGH (ref 32.0–45.0)

## 2021-06-03 LAB — BETA-HYDROXYBUTYRIC ACID: Beta-Hydroxybutyric Acid: 4.99 mmol/L — ABNORMAL HIGH (ref 0.05–0.27)

## 2021-06-03 LAB — RESP PANEL BY RT-PCR (FLU A&B, COVID) ARPGX2
Influenza A by PCR: NEGATIVE
Influenza B by PCR: NEGATIVE
SARS Coronavirus 2 by RT PCR: NEGATIVE

## 2021-06-03 LAB — PHOSPHORUS: Phosphorus: 3.9 mg/dL (ref 2.5–4.6)

## 2021-06-03 LAB — TROPONIN I (HIGH SENSITIVITY)
Troponin I (High Sensitivity): 7 ng/L (ref <18)
Troponin I (High Sensitivity): 15 ng/L (ref <18)

## 2021-06-03 LAB — LACTIC ACID, PLASMA: Lactic Acid, Venous: 2.4 mmol/L (ref 0.5–1.9)

## 2021-06-03 LAB — LIPASE, BLOOD: Lipase: 891 U/L — ABNORMAL HIGH (ref 11–51)

## 2021-06-03 MED ORDER — IOHEXOL 350 MG/ML SOLN
80.0000 mL | Freq: Once | INTRAVENOUS | Status: AC | PRN
  Administered 2021-06-03: 80 mL via INTRAVENOUS

## 2021-06-03 MED ORDER — FENTANYL CITRATE (PF) 100 MCG/2ML IJ SOLN
50.0000 ug | Freq: Once | INTRAMUSCULAR | Status: AC
Start: 2021-06-03 — End: 2021-06-03
  Administered 2021-06-03: 50 ug via INTRAVENOUS
  Filled 2021-06-03: qty 2

## 2021-06-03 MED ORDER — HYDROMORPHONE HCL 1 MG/ML IJ SOLN
0.5000 mg | INTRAMUSCULAR | Status: DC | PRN
  Administered 2021-06-03 – 2021-06-06 (×10): 0.5 mg via INTRAVENOUS
  Filled 2021-06-03 (×10): qty 1

## 2021-06-03 MED ORDER — FOLIC ACID 1 MG PO TABS
1.0000 mg | ORAL_TABLET | Freq: Every day | ORAL | Status: DC
  Administered 2021-06-06 – 2021-06-08 (×3): 1 mg via ORAL
  Filled 2021-06-03 (×4): qty 1

## 2021-06-03 MED ORDER — ADULT MULTIVITAMIN W/MINERALS CH
1.0000 | ORAL_TABLET | Freq: Every day | ORAL | Status: DC
  Administered 2021-06-06 – 2021-06-08 (×3): 1 via ORAL
  Filled 2021-06-03 (×4): qty 1

## 2021-06-03 MED ORDER — LACTATED RINGERS IV BOLUS
20.0000 mL/kg | Freq: Once | INTRAVENOUS | Status: AC
  Administered 2021-06-03: 1000 mL via INTRAVENOUS

## 2021-06-03 MED ORDER — THIAMINE HCL 100 MG PO TABS
100.0000 mg | ORAL_TABLET | Freq: Every day | ORAL | Status: DC
  Administered 2021-06-06 – 2021-06-08 (×3): 100 mg via ORAL
  Filled 2021-06-03 (×4): qty 1

## 2021-06-03 MED ORDER — LACTATED RINGERS IV SOLN: INTRAVENOUS | Status: DC

## 2021-06-03 MED ORDER — LORAZEPAM 1 MG PO TABS
1.0000 mg | ORAL_TABLET | ORAL | Status: AC | PRN
  Administered 2021-06-04 – 2021-06-05 (×2): 1 mg via ORAL
  Filled 2021-06-03 (×2): qty 1

## 2021-06-03 MED ORDER — THIAMINE HCL 100 MG/ML IJ SOLN
100.0000 mg | Freq: Every day | INTRAMUSCULAR | Status: DC
  Administered 2021-06-04 – 2021-06-05 (×2): 100 mg via INTRAVENOUS
  Filled 2021-06-03 (×2): qty 2

## 2021-06-03 MED ORDER — DEXTROSE 50 % IV SOLN: 0.0000 mL | INTRAVENOUS | Status: DC | PRN

## 2021-06-03 MED ORDER — LORAZEPAM 2 MG/ML IJ SOLN
1.0000 mg | INTRAMUSCULAR | Status: AC | PRN
  Administered 2021-06-04 (×5): 2 mg via INTRAVENOUS
  Filled 2021-06-03 (×5): qty 1

## 2021-06-03 MED ORDER — INSULIN REGULAR(HUMAN) IN NACL 100-0.9 UT/100ML-% IV SOLN
INTRAVENOUS | Status: DC
  Administered 2021-06-03: 15 [IU]/h via INTRAVENOUS
  Administered 2021-06-04: 13 [IU]/h via INTRAVENOUS
  Administered 2021-06-04: 22 [IU]/h via INTRAVENOUS
  Filled 2021-06-03 (×3): qty 100

## 2021-06-03 MED ORDER — DEXTROSE IN LACTATED RINGERS 5 % IV SOLN: INTRAVENOUS | Status: DC

## 2021-06-03 NOTE — ED Provider Notes (Signed)
[facility]-EMERGENCY DEPT Provider Note   CSN: [id number] Arrival date & time: 06/03/21  1812     History Chief Complaint  Patient presents with   Hyperglycemia   Abdominal Pain    Trevaun A Sleeth is a 35 y.o. male here presenting with hyperglycemia.  Patient states that for the last 3 to 4 days, he has been having diffuse abdominal pain.  He states that he has been nauseated.  He states that he has been very thirsty and urinating a lot.  He states that he felt like he was dehydrated.  He has been drinking alcohol and also sweet tea.  Patient went to urgent care and has a glucose greater than 600.  Patient has no known history of diabetes.   The history is provided by the patient.      History reviewed. No pertinent past medical history.  There are no problems to display for this patient.   History reviewed. No pertinent surgical history.     History reviewed. No pertinent family history.  Social History   Tobacco Use   Smoking status: Former   Smokeless tobacco: Never  Substance Use Topics   Alcohol use: Yes    Comment: 1 pint a night of alcohol   Drug use: No    Home Medications Prior to Admission medications   Medication Sig Start Date End Date Taking? Authorizing Provider  dicyclomine (BENTYL) 10 MG capsule Take 1 capsule (10 mg total) by mouth 3 (three) times daily before meals. 11/10/17   Fayrene Helper, PA-C  methocarbamol (ROBAXIN) 500 MG tablet Take 1 tablet (500 mg total) by mouth 2 (two) times daily as needed for muscle spasms. 02/06/16   Ward, Chase Picket, PA-C  naproxen (NAPROSYN) 500 MG tablet Take 1 tablet (500 mg total) by mouth 2 (two) times daily. 04/10/19   Antony Madura, PA-C  ondansetron (ZOFRAN) 4 MG tablet Take 1 tablet (4 mg total) by mouth every 8 (eight) hours as needed for nausea or vomiting. 11/10/17   Fayrene Helper, PA-C    Allergies    Patient has no known allergies.  Review of Systems   Review of Systems   Gastrointestinal:  Positive for abdominal pain.  All other systems reviewed and are negative.  Physical Exam Updated Vital Signs BP (!) 158/98   Pulse 78   Temp 97.7 F (36.5 C) (Axillary)   Resp (!) 22   Ht 5\' 6"  (1.676 m)   Wt 91 kg   SpO2 98%   BMI 32.38 kg/m   Physical Exam Vitals and nursing note reviewed.  Constitutional:      Appearance: He is well-developed.  HENT:     Head: Normocephalic.     Mouth/Throat:     Mouth: Mucous membranes are moist.  Eyes:     Extraocular Movements: Extraocular movements intact.  Cardiovascular:     Rate and Rhythm: Normal rate and regular rhythm.  Abdominal:     General: Abdomen is flat.  Skin:    General: Skin is warm.     Capillary Refill: Capillary refill takes less than 2 seconds.  Neurological:     General: No focal deficit present.     Mental Status: He is alert and oriented to person, place, and time.  Psychiatric:        Mood and Affect: Mood normal.        Behavior: Behavior normal.    ED Results / Procedures / Treatments   Labs (all labs ordered  are listed, but only abnormal results are displayed) Labs Reviewed  CBC WITH DIFFERENTIAL/PLATELET - Abnormal; Notable for the following components:      Result Value   RBC 5.84 (*)    Hemoglobin 17.7 (*)    All other components within normal limits  COMPREHENSIVE METABOLIC PANEL - Abnormal; Notable for the following components:   Sodium 130 (*)    Potassium 5.2 (*)    Chloride 88 (*)    CO2 21 (*)    Glucose, Bld 698 (*)    Creatinine, Ser 1.36 (*)    Total Protein 9.0 (*)    ALT 52 (*)    Total Bilirubin 1.4 (*)    Anion gap 21 (*)    All other components within normal limits  URINALYSIS, ROUTINE W REFLEX MICROSCOPIC - Abnormal; Notable for the following components:   Glucose, UA >=500 (*)    Hgb urine dipstick TRACE (*)    Ketones, ur >80 (*)    All other components within normal limits  BLOOD GAS, VENOUS - Abnormal; Notable for the following components:    pO2, Ven 53.6 (*)    Acid-base deficit 5.6 (*)    All other components within normal limits  CBG MONITORING, ED - Abnormal; Notable for the following components:   Glucose-Capillary >600 (*)    All other components within normal limits  I-STAT CHEM 8, ED - Abnormal; Notable for the following components:   Sodium 126 (*)    Potassium 5.2 (*)    Chloride 91 (*)    BUN 21 (*)    Glucose, Bld >700 (*)    Calcium, Ion 1.12 (*)    Hemoglobin 19.0 (*)    HCT 56.0 (*)    All other components within normal limits  RESP PANEL BY RT-PCR (FLU A&B, COVID) ARPGX2  URINALYSIS, MICROSCOPIC (REFLEX)  BETA-HYDROXYBUTYRIC ACID  BETA-HYDROXYBUTYRIC ACID  LIPASE, BLOOD  CBG MONITORING, ED    EKG None  Radiology No results found.  Procedures Procedures   CRITICAL CARE Performed by: Richardean Canal   Total critical care time: 30 minutes  Critical care time was exclusive of separately billable procedures and treating other patients.  Critical care was necessary to treat or prevent imminent or life-threatening deterioration.  Critical care was time spent personally by me on the following activities: development of treatment plan with patient and/or surrogate as well as nursing, discussions with consultants, evaluation of patient's response to treatment, examination of patient, obtaining history from patient or surrogate, ordering and performing treatments and interventions, ordering and review of laboratory studies, ordering and review of radiographic studies, pulse oximetry and re-evaluation of patient's condition.   Medications Ordered in ED Medications  insulin regular, human (MYXREDLIN) 100 units/ 100 mL infusion (15 Units/hr Intravenous New Bag/Given 06/03/21 2016)  lactated ringers infusion ( Intravenous New Bag/Given 06/03/21 2020)  dextrose 5 % in lactated ringers infusion (0 mLs Intravenous Hold 06/03/21 2003)  dextrose 50 % solution 0-50 mL (has no administration in time range)   lactated ringers bolus 20 mL/kg (1,000 mLs Intravenous New Bag/Given 06/03/21 1844)  fentaNYL (SUBLIMAZE) injection 50 mcg (50 mcg Intravenous Given 06/03/21 2012)    ED Course  I have reviewed the triage vital signs and the nursing notes.  Pertinent labs & imaging results that were available during my care of the patient were reviewed by me and considered in my medical decision making (see chart for details).    MDM Rules/Calculators/A&P  Elzie A Mullens is a 35 y.o. male here presenting with increased thirstiness and abdominal pain.  Concern for possible new onset diabetes and DKA versus HHS.  Will hydrate patient.  Will start insulin drip.  Will get DKA labs  8:24 PM Labs showed anion gap of 21 and glucose of 698.  Patient has greater than 80 ketones in his urine.  Patient is started on IV insulin.  Will admit for DKA.   Final Clinical Impression(s) / ED Diagnoses Final diagnoses:  None    Rx / DC Orders ED Discharge Orders     None        Charlynne Pander, MD 06/03/21 2025

## 2021-06-03 NOTE — H&P (Signed)
Edward Ware JHE:[id number] DOB: 07-24-1986 DOA: 06/03/2021     PCP: Patient, No Pcp Per (Inactive)   Outpatient Specialists:  NONE    Patient arrived to ER on 06/03/21 at 1812 Referred by Attending Therisa Doyne, MD   Patient coming from: home Lives  With family    Chief Complaint:    Chief Complaint  Patient presents with   Hyperglycemia   Abdominal Pain    HPI: Edward Ware is a 35 y.o. male with medical history significant of  obesity, tobacco abuse, alcohol abuse    Presented with   abdominal pain for the past 2 days  Associated with nausea Have been extra thirsty and urinating a lot for the past 5 days Stopped drinking 2 days ago NO CP, no cough    Patient reports he drinks about a pint a day and has been drinking for past 2 years He has quit in the past and denies history of alcohol withdrawal no seizures no hallucinations no shakes. Patient states his last EtOH was 48 hours ago and he is not experiencing any tremors currently  Has  NOt been vaccinated against COVID      Initial COVID TEST  in house  PCR testing  Pending  No results found for: SARSCOV2NAA   Regarding pertinent Chronic problems:        Morbid obesity-   BMI Readings from Last 1 Encounters:  06/03/21 32.38 kg/m    While in ER: Labs worrisome for DKA ketones in urine blood sugars above 698 Started on insulin drip Lipase obtained but currently pending   ED Triage Vitals  Enc Vitals Group     BP 06/03/21 1826 (!) 148/97     Pulse Rate 06/03/21 1826 87     Resp 06/03/21 1832 16     Temp 06/03/21 1826 97.7 F (36.5 C)     Temp Source 06/03/21 1826 Axillary     SpO2 06/03/21 1826 97 %     Weight 06/03/21 1832 200 lb 9.9 oz (91 kg)     Height 06/03/21 1832 5\' 6"  (1.676 m)     Head Circumference --      Peak Flow --      Pain Score 06/03/21 1827 9     Pain Loc --      Pain Edu? --      Excl. in GC? --   TMAX(24)@      _________________________________________ Significant initial  Findings: Abnormal Labs Reviewed  CBC WITH DIFFERENTIAL/PLATELET - Abnormal; Notable for the following components:      Result Value   RBC 5.84 (*)    Hemoglobin 17.7 (*)    All other components within normal limits  COMPREHENSIVE METABOLIC PANEL - Abnormal; Notable for the following components:   Sodium 130 (*)    Potassium 5.2 (*)    Chloride 88 (*)    CO2 21 (*)    Glucose, Bld 698 (*)    Creatinine, Ser 1.36 (*)    Total Protein 9.0 (*)    ALT 52 (*)    Total Bilirubin 1.4 (*)    Anion gap 21 (*)    All other components within normal limits  URINALYSIS, ROUTINE W REFLEX MICROSCOPIC - Abnormal; Notable for the following components:   Glucose, UA >=500 (*)    Hgb urine dipstick TRACE (*)    Ketones, ur >80 (*)    All other components within normal limits  BLOOD GAS, VENOUS - Abnormal; Notable  for the following components:   pO2, Ven 53.6 (*)    Acid-base deficit 5.6 (*)    All other components within normal limits  LIPASE, BLOOD - Abnormal; Notable for the following components:   Lipase 891 (*)    All other components within normal limits  CBG MONITORING, ED - Abnormal; Notable for the following components:   Glucose-Capillary >600 (*)    All other components within normal limits  I-STAT CHEM 8, ED - Abnormal; Notable for the following components:   Sodium 126 (*)    Potassium 5.2 (*)    Chloride 91 (*)    BUN 21 (*)    Glucose, Bld >700 (*)    Calcium, Ion 1.12 (*)    Hemoglobin 19.0 (*)    HCT 56.0 (*)    All other components within normal limits  CBG MONITORING, ED - Abnormal; Notable for the following components:   Glucose-Capillary 542 (*)    All other components within normal limits   ____________________________________________  CXR -  NON acute  CTabd/pelvis - showing pancreatitis and hepatic statosis    _________________________ Troponin  ordered ECG: Ordered Personally reviewed by me  showing: HR : 108 Rhythm:   Sinus tachycardia   nonspecific changes, repolarization abnormalities QTC 444 _______________  The recent clinical data is shown below. Vitals:   06/03/21 1826 06/03/21 1832 06/03/21 1900 06/03/21 1930  BP: (!) 148/97  (!) 159/89 (!) 158/98  Pulse: 87  90 78  Resp:  16 18 (!) 22  Temp: 97.7 F (36.5 C)     TempSrc: Axillary     SpO2: 97%  97% 98%  Weight:  91 kg    Height:  5\' 6"  (1.676 m)            Component Value Date/Time   WBC 8.2 06/03/2021 1837   LYMPHSABS 1.1 06/03/2021 1837   MONOABS 0.7 06/03/2021 1837   EOSABS 0.1 06/03/2021 1837   BASOSABS 0.0 06/03/2021 1837      Lactic Acid, Venous ordered No results found for: LATICACIDVEN      UA   evidence of UTI       Urine analysis:    Component Value Date/Time   COLORURINE YELLOW 06/03/2021 1837   APPEARANCEUR CLEAR 06/03/2021 1837   LABSPEC 1.010 06/03/2021 1837   PHURINE 5.5 06/03/2021 1837   GLUCOSEU >=500 (A) 06/03/2021 1837   HGBUR TRACE (A) 06/03/2021 1837   BILIRUBINUR NEGATIVE 06/03/2021 1837   KETONESUR >80 (A) 06/03/2021 1837   PROTEINUR NEGATIVE 06/03/2021 1837   UROBILINOGEN 0.2 10/08/2013 1230   NITRITE NEGATIVE 06/03/2021 1837   LEUKOCYTESUR NEGATIVE 06/03/2021 1837    Results for orders placed or performed during the hospital encounter of 10/08/13  GC/Chlamydia Probe Amp     Status: None   Collection Time: 10/08/13 11:40 AM   Specimen: Genital  Result Value Ref Range Status   CT Probe RNA NEGATIVE NEGATIVE Final   GC Probe RNA NEGATIVE NEGATIVE Final    Comment: (NOTE)                                                                                       **  Normal Reference Range: Negative**      Assay performed using the Gen-Probe APTIMA COMBO2 (R) Assay. Acceptable specimen types for this assay include APTIMA Swabs (Unisex, endocervical, urethral, or vaginal), first void urine, and ThinPrep liquid based cytology samples. Performed at [facility]      _______________________________________________ Hospitalist was called for admission for DKA/HHS and pancreatitis  The following Work up has been ordered so far:  Orders Placed This Encounter  Procedures   Resp Panel by RT-PCR (Flu A&B, Covid) Nasopharyngeal Swab   CBC with Differential/Platelet   Comprehensive metabolic panel   Beta-hydroxybutyric acid   Urinalysis, Routine w reflex microscopic   Blood gas, venous   Lipase, blood   Urinalysis, Microscopic (reflex)   Diet NPO time specified   Cardiac monitoring   Initiate Carrier Fluid Protocol   Notify physician (specify)   If present, discontinue Insulin Pump after IV Insulin is initiated.   Do NOT use lab glucose values in [employer].  If CBG meter reads "Critical High", enter 600.   IV insulin infusion with sufficient glucose should be continued until MD determines acidosis is corrected and places transition orders.   Upon IV fluid bolus completion, place order for STAT BMET (LAB15) and call provider with results.   Height and weight   Cardiac monitoring   Consult to hospitalist   Pulse oximetry, continuous   CBG monitoring, ED   I-stat chem 8, ED (not at [facility] or [facility])   CBG monitoring, ED   CBG monitoring, ED   Insert peripheral IV   Place in observation (patient's expected length of stay will be less than 2 midnights)    Following Medications were ordered in ER: Medications  insulin regular, human (MYXREDLIN) 100 units/ 100 mL infusion (15 Units/hr Intravenous New Bag/Given 06/03/21 2016)  lactated ringers infusion ( Intravenous New Bag/Given 06/03/21 2020)  dextrose 5 % in lactated ringers infusion (0 mLs Intravenous Hold 06/03/21 2003)  dextrose 50 % solution 0-50 mL (has no administration in time range)  lactated ringers bolus 20 mL/kg (1,000 mLs Intravenous New Bag/Given 06/03/21 1844)  fentaNYL (SUBLIMAZE) injection 50 mcg (50 mcg Intravenous Given 06/03/21 2012)        Consult Orders  (From admission, onward)            Start     Ordered   06/03/21 2021  Consult to hospitalist  Once       Provider:  (Not yet assigned)  Question Answer Comment  Place call to: [facility]   Reason for Consult Admit      06/03/21 2020              OTHER Significant initial  Findings:  labs showing:    Recent Labs  Lab 06/03/21 1837 06/03/21 1851  NA 130* 126*  K 5.2* 5.2*  CO2 21*  --   GLUCOSE 698* >700*  BUN 18 21*  CREATININE 1.36* 1.10  CALCIUM 9.8  --     Cr   stable,  Lab Results  Component Value Date   CREATININE 1.10 06/03/2021   CREATININE 1.36 (H) 06/03/2021   CREATININE 1.06 11/10/2017    Recent Labs  Lab 06/03/21 1837  AST 35  ALT 52*  ALKPHOS 82  BILITOT 1.4*  PROT 9.0*  ALBUMIN 4.8   Lab Results  Component Value Date   CALCIUM 9.8 06/03/2021    Plt: Lab Results  Component Value Date   PLT 265 06/03/2021     Venous  Blood  Gas result:  pH 7.291  pCO2 44.2       Recent Labs  Lab 06/03/21 1837 06/03/21 1851  WBC 8.2  --   NEUTROABS 6.3  --   HGB 17.7* 19.0*  HCT 51.9 56.0*  MCV 88.9  --   PLT 265  --     HG/HCT    Up from baseline see below    Component Value Date/Time   HGB 19.0 (H) 06/03/2021 1851   HCT 56.0 (H) 06/03/2021 1851   MCV 88.9 06/03/2021 1837      Recent Labs  Lab 06/03/21 1837  LIPASE 891*       DM  labs:  HbA1C: No results for input(s): HGBA1C in the last 8760 hours.     CBG (last 3)  Recent Labs    06/03/21 1823  GLUCAP >600*        Cultures: No results found for: SDES, SPECREQUEST, CULT, REPTSTATUS   Radiological Exams on Admission: CT ABDOMEN PELVIS WO CONTRAST  Result Date: 06/03/2021 CLINICAL DATA:  Diffuse abdominal pain with abdominal distension. Nausea. EXAM: CT ABDOMEN AND PELVIS WITHOUT CONTRAST TECHNIQUE: Multidetector CT imaging of the abdomen and pelvis was performed following the standard protocol without IV contrast. COMPARISON:  None. FINDINGS: Lower chest: The lung bases are clear.   The heart is normal in size. Hepatobiliary: Advanced hepatic steatosis with diffusely decreased hepatic density with areas of focal fatty sparing. The liver is enlarged spanning 20.2 cm cranial caudal. There is no obvious focal hepatic lesion on this unenhanced exam. Possible sludge in the gallbladder without calcified stone or pericholecystic inflammation. No biliary dilatation. Pancreas: Peripancreatic fat stranding with enlargement of the pancreatic head and uncinate process. Ill-defined stranding and fluid tracks into the right retroperitoneum in the anterior pararenal space. No pancreatic ductal dilatation. No acute peripancreatic collection. No parenchymal air. Spleen: Normal in size without focal abnormality. Adrenals/Urinary Tract: Normal adrenal glands. No hydronephrosis or renal calculi. No perinephric edema. No evidence of focal renal abnormality on this unenhanced exam. Partially distended urinary bladder Stomach/Bowel: Ingested material within the stomach. Mild wall thickening of the duodenum is felt to be reactive. Remainder of the small bowel is decompressed. Normal appendix. Small to moderate colonic stool burden. Vascular/Lymphatic: Normal caliber abdominal aorta. No aortic atherosclerosis. No portal venous or mesenteric gas. No abdominopelvic adenopathy. Reproductive: Prostate is unremarkable. Other: Peripancreatic fat stranding and inflammation with ill-defined free fluid tracking in the right retroperitoneum and anterior pararenal space. No organized collection or free air no abdominal wall hernia. Musculoskeletal: There are no acute or suspicious osseous abnormalities. There is a tiny vertebral body hemangioma within L4. Peripherally sclerotic lesion in the left inferior pubic ramus has a benign appearance. IMPRESSION: 1. Acute edematous pancreatitis. No acute peripancreatic collection or evidence of pancreatic necrosis on this unenhanced exam. 2. Advanced hepatic steatosis and hepatomegaly.  Electronically Signed   By: Narda Rutherford M.D.   On: 06/03/2021 21:36   DG CHEST PORT 1 VIEW  Result Date: 06/03/2021 CLINICAL DATA:  DKA, upper abdominal pain EXAM: PORTABLE CHEST 1 VIEW COMPARISON:  None. FINDINGS: Cardiomediastinal silhouette is within normal limits. There is no focal airspace disease. There is no large pleural effusion or visible pneumothorax. There is no acute osseous abnormality. IMPRESSION: No evidence of acute cardiopulmonary disease. Electronically Signed   By: Caprice Renshaw M.D.   On: 06/03/2021 21:21   _______________________________________________________________________________________________________ Latest  Blood pressure (!) 158/98, pulse 78, temperature 97.7 F (36.5 C), temperature source Axillary, resp. rate Marland Kitchen)  22, height  (1.676 m), weight 91 kg, SpO2 98 %.   Review of Systems:    Pertinent positives include:  fatigue, abdominal pain, nausea Polyuria/polydipsia  Constitutional:  No weight loss, night sweats, Fevers, chills,  weight loss  HEENT:  No headaches, Difficulty swallowing,Tooth/dental problems,Sore throat,  No sneezing, itching, ear ache, nasal congestion, post nasal drip,  Cardio-vascular:  No chest pain, Orthopnea, PND, anasarca, dizziness, palpitations.no Bilateral lower extremity swelling  GI:  No heartburn, indigestion,, vomiting, diarrhea, change in bowel habits, loss of appetite, melena, blood in stool, hematemesis Resp:  no shortness of breath at rest. No dyspnea on exertion, No excess mucus, no productive cough, No non-productive cough, No coughing up of blood.No change in color of mucus.No wheezing. Skin:  no rash or lesions. No jaundice GU:  no dysuria, change in color of urine, no urgency or frequency. No straining to urinate.  No flank pain.  Musculoskeletal:  No joint pain or no joint swelling. No decreased range of motion. No back pain.  Psych:  No change in mood or affect. No depression or anxiety. No memory loss.   Neuro: no localizing neurological complaints, no tingling, no weakness, no double vision, no gait abnormality, no slurred speech, no confusion  All systems reviewed and apart from HOPI all are negative _______________________________________________________________________________________________ Past Medical History:  History reviewed. No pertinent past medical history.    History reviewed. No pertinent surgical history.  Social History:  Ambulatory   independently      reports that he has quit smoking. He has never used smokeless tobacco. He reports current alcohol use. He reports that he does not use drugs.     Family History:   Family History  Problem Relation Age of Onset   Diabetes Mother    Hypertension Mother    Diabetes Other    ______________________________________________________________________________________________ Allergies: No Known Allergies   Prior to Admission medications   Medication Sig Start Date End Date Taking? Authorizing Provider  dicyclomine (BENTYL) 10 MG capsule Take 1 capsule (10 mg total) by mouth 3 (three) times daily before meals. 11/10/17   Fayrene Helper, PA-C  methocarbamol (ROBAXIN) 500 MG tablet Take 1 tablet (500 mg total) by mouth 2 (two) times daily as needed for muscle spasms. 02/06/16   Ward, Chase Picket, PA-C  naproxen (NAPROSYN) 500 MG tablet Take 1 tablet (500 mg total) by mouth 2 (two) times daily. 04/10/19   Antony Madura, PA-C  ondansetron (ZOFRAN) 4 MG tablet Take 1 tablet (4 mg total) by mouth every 8 (eight) hours as needed for nausea or vomiting. 11/10/17   Fayrene Helper, PA-C    ___________________________________________________________________________________________________ Physical Exam: Vitals with BMI 06/03/2021 06/03/2021 06/03/2021  Height - -   Weight - - 200 lbs 10 oz  BMI - - 32.4  Systolic 158 159 -  Diastolic 98 89 -  Pulse 78 90 -     1. General:  in No  Acute distress   Chronically ill  -appearing 2. Psychological: Alert and   Oriented 3. Head/ENT: Dry Mucous Membranes                          Head Non traumatic, neck supple                          Normal  Dentition 4. SKIN:   decreased Skin turgor,  Skin clean Dry and intact no rash 5. Heart:  Regular rate and rhythm no  Murmur, no Rub or gallop 6. Lungs:  Clear to auscultation bilaterally, no wheezes or crackles   7. Abdomen: Soft, epigastric tenderness, Non distended   obese  bowel sounds diminished 8. Lower extremities: no clubbing, cyanosis, no  edema 9. Neurologically Grossly intact, moving all 4 extremities equally  no tremor 10. MSK: Normal range of motion    Chart has been reviewed  ______________________________________________________________________________________________  Assessment/Plan 35 y.o. male with medical history significant of  obesity, tobacco abuse, alcohol abuse  Admitted for  DKA/HHS and pancreatitis  Present on Admission:  DKA /HHS(diabetic ketoacidosis) (HCC)  - will admit per DKA/ HHS protocol, obtain serial BMET, start on glucosestabalizer, aggressive IVF.   Change IVF to D5 1/2Na after BG <250 expect prolonged NPO state due to pancreatitis  So far work up of possible causes of DKA/HSS with CXR, ECG one set of cardiac enzymes, UA.    Most likely cause new onset DM and pancreatitis Monitor in Stepdown. Replace potassium as needed.    Consult diabetes coordinator Likely new DM 2 onset will need diabetic education prior to DC   Pancreatitis - most likely cause being alcohol induced,   CT of abdomen showing acute pancreatitis  no evidence of pseudocyst Will rehydrate with aggressive IV fluids Keep n.p.o. Follow clinically  - strongly advised patient to stop drinking alcohol -Check lipid panel  Control pain with IV pain medications If no significant improvement in the next 24 hours may benefit from GI consult      ETOH abuse - order CIWA protocol, motitor for any signs of  withdraw Counseled regarding importance of quitting   Tobacco abuse -  - Spoke about importance of quitting spent 5 minutes discussing options for treatment, prior attempts at quitting, and dangers of smoking  - pt declined nicotine patch   - nursing tobacco cessation protocol    Dehydration - will rehydrate   Other plan as per orders.  DVT prophylaxis:  SCD     Code Status:    Code Status: Not on file FULL CODE   as per patient   I had personally discussed CODE STATUS with patient     Family Communication:   Family   at  Bedside  plan of care was discussed  with   mother  Disposition Plan:     To home once workup is complete and patient is stable   Following barriers for discharge:                            Electrolytes corrected                                Pain controlled with PO medications                                be able to tolerate PO                            Will likely need home health, home O2, set up                         Diabetes care coordinator  Transition of care consulted                                 Consults called: none  Admission status:  ED Disposition     ED Disposition  Admit   Condition  --   Comment  Hospital [facility]: [facility] [[id number]]  Level of Care: Stepdown [14]  Admit to SDU based on following criteria: Hemodynamic compromise or significant risk of instability:  Patient requiring short term acute titration and management of vasoactive drips, and invasive monitoring (i.e., CVP and Arterial line).  May place patient in observation at [facility] or [facility] if equivalent level of care is available:: No  Covid Evaluation: Asymptomatic Screening Protocol (No Symptoms)  Diagnosis: DKA (diabetic ketoacidosis) (HCC) [696295]  Admitting Physician: Therisa Doyne [[id number]]  Attending Physician: Therisa Doyne [[id number]]           Obs        SDU tele indefinitely please  discontinue once patient no longer qualifies COVID-19 Labs    No results found for: SARSCOV2NAA   Precautions: admitted as  asymptomatic screening protocol   PPE: Used by the provider:   N95  eye Goggles,  Gloves     Anibal Quinby 06/03/2021, 9:48 PM    [facility]     after 2 AM please page floor coverage PA If 7AM-7PM, please contact the day team taking care of the patient using [email or web address]   Patient was evaluated in the context of the global COVID-19 pandemic, which necessitated consideration that the patient might be at risk for infection with the SARS-CoV-2 virus that causes COVID-19. Institutional protocols and algorithms that pertain to the evaluation of patients at risk for COVID-19 are in a state of rapid change based on information released by regulatory bodies including the CDC and federal and state organizations. These policies and algorithms were followed during the patient's care.

## 2021-06-03 NOTE — ED Notes (Signed)
Patient transported to CT 

## 2021-06-03 NOTE — ED Notes (Signed)
RN asked this Clinical research associate to retake pt's CBG

## 2021-06-03 NOTE — ED Triage Notes (Signed)
Pt came in POV from urgent care c/o increased urination, thirst for 5 days along with abdominal pain since yesterday. Patient blood sugar was to high to read at the urgent care and patient was referred here. Patient tried Aleve without relief for abdominal pain.

## 2021-06-03 NOTE — ED Notes (Signed)
Lactic acid 2.4  MD notified

## 2021-06-04 LAB — GLUCOSE, CAPILLARY
Glucose-Capillary: 232 mg/dL — ABNORMAL HIGH (ref 70–99)
Glucose-Capillary: 216 mg/dL — ABNORMAL HIGH (ref 70–99)
Glucose-Capillary: 210 mg/dL — ABNORMAL HIGH (ref 70–99)
Glucose-Capillary: 243 mg/dL — ABNORMAL HIGH (ref 70–99)
Glucose-Capillary: 252 mg/dL — ABNORMAL HIGH (ref 70–99)
Glucose-Capillary: 318 mg/dL — ABNORMAL HIGH (ref 70–99)
Glucose-Capillary: 332 mg/dL — ABNORMAL HIGH (ref 70–99)
Glucose-Capillary: 377 mg/dL — ABNORMAL HIGH (ref 70–99)
Glucose-Capillary: 299 mg/dL — ABNORMAL HIGH (ref 70–99)
Glucose-Capillary: 270 mg/dL — ABNORMAL HIGH (ref 70–99)
Glucose-Capillary: 249 mg/dL — ABNORMAL HIGH (ref 70–99)
Glucose-Capillary: 198 mg/dL — ABNORMAL HIGH (ref 70–99)
Glucose-Capillary: 222 mg/dL — ABNORMAL HIGH (ref 70–99)
Glucose-Capillary: 236 mg/dL — ABNORMAL HIGH (ref 70–99)
Glucose-Capillary: 225 mg/dL — ABNORMAL HIGH (ref 70–99)
Glucose-Capillary: 282 mg/dL — ABNORMAL HIGH (ref 70–99)
Glucose-Capillary: 329 mg/dL — ABNORMAL HIGH (ref 70–99)
Glucose-Capillary: 310 mg/dL — ABNORMAL HIGH (ref 70–99)

## 2021-06-04 LAB — COMPREHENSIVE METABOLIC PANEL
ALT: 48 U/L — ABNORMAL HIGH (ref 0–44)
AST: 33 U/L (ref 15–41)
Albumin: 4.6 g/dL (ref 3.5–5.0)
Alkaline Phosphatase: 80 U/L (ref 38–126)
Anion gap: 15 (ref 5–15)
BUN: 16 mg/dL (ref 6–20)
CO2: 21 mmol/L — ABNORMAL LOW (ref 22–32)
Calcium: 9.6 mg/dL (ref 8.9–10.3)
Chloride: 96 mmol/L — ABNORMAL LOW (ref 98–111)
Creatinine, Ser: 0.95 mg/dL (ref 0.61–1.24)
GFR, Estimated: >60 mL/min (ref >60)
Glucose, Bld: 208 mg/dL — ABNORMAL HIGH (ref 70–99)
Potassium: 3.7 mmol/L (ref 3.5–5.1)
Sodium: 132 mmol/L — ABNORMAL LOW (ref 135–145)
Total Bilirubin: 1.2 mg/dL (ref 0.3–1.2)
Total Protein: 8.6 g/dL — ABNORMAL HIGH (ref 6.5–8.1)

## 2021-06-04 LAB — BASIC METABOLIC PANEL
Anion gap: 13 (ref 5–15)
Anion gap: 15 (ref 5–15)
Anion gap: 13 (ref 5–15)
Anion gap: 13 (ref 5–15)
BUN: 15 mg/dL (ref 6–20)
BUN: 17 mg/dL (ref 6–20)
BUN: 17 mg/dL (ref 6–20)
BUN: 16 mg/dL (ref 6–20)
CO2: 22 mmol/L (ref 22–32)
CO2: 21 mmol/L — ABNORMAL LOW (ref 22–32)
CO2: 21 mmol/L — ABNORMAL LOW (ref 22–32)
CO2: 23 mmol/L (ref 22–32)
Calcium: 9.7 mg/dL (ref 8.9–10.3)
Calcium: 9.7 mg/dL (ref 8.9–10.3)
Calcium: 9.9 mg/dL (ref 8.9–10.3)
Calcium: 9.3 mg/dL (ref 8.9–10.3)
Chloride: 97 mmol/L — ABNORMAL LOW (ref 98–111)
Chloride: 97 mmol/L — ABNORMAL LOW (ref 98–111)
Chloride: 101 mmol/L (ref 98–111)
Chloride: 96 mmol/L — ABNORMAL LOW (ref 98–111)
Creatinine, Ser: 1.03 mg/dL (ref 0.61–1.24)
Creatinine, Ser: 1.07 mg/dL (ref 0.61–1.24)
Creatinine, Ser: 0.98 mg/dL (ref 0.61–1.24)
Creatinine, Ser: 1.09 mg/dL (ref 0.61–1.24)
GFR, Estimated: >60 mL/min (ref >60)
GFR, Estimated: >60 mL/min (ref >60)
GFR, Estimated: >60 mL/min (ref >60)
GFR, Estimated: >60 mL/min (ref >60)
Glucose, Bld: 205 mg/dL — ABNORMAL HIGH (ref 70–99)
Glucose, Bld: 210 mg/dL — ABNORMAL HIGH (ref 70–99)
Glucose, Bld: 179 mg/dL — ABNORMAL HIGH (ref 70–99)
Glucose, Bld: 240 mg/dL — ABNORMAL HIGH (ref 70–99)
Potassium: 3.8 mmol/L (ref 3.5–5.1)
Potassium: 4 mmol/L (ref 3.5–5.1)
Potassium: 3.7 mmol/L (ref 3.5–5.1)
Potassium: 4.1 mmol/L (ref 3.5–5.1)
Sodium: 132 mmol/L — ABNORMAL LOW (ref 135–145)
Sodium: 133 mmol/L — ABNORMAL LOW (ref 135–145)
Sodium: 135 mmol/L (ref 135–145)
Sodium: 132 mmol/L — ABNORMAL LOW (ref 135–145)

## 2021-06-04 LAB — RAPID URINE DRUG SCREEN, HOSP PERFORMED
Amphetamines: NOT DETECTED
Barbiturates: NOT DETECTED
Benzodiazepines: NOT DETECTED
Cocaine: NOT DETECTED
Opiates: NOT DETECTED
Tetrahydrocannabinol: NOT DETECTED

## 2021-06-04 LAB — HEMOGLOBIN A1C
Hgb A1c MFr Bld: 12.3 % — ABNORMAL HIGH (ref 4.8–5.6)
Mean Plasma Glucose: 306.31 mg/dL

## 2021-06-04 LAB — LIPID PANEL
Cholesterol: 269 mg/dL — ABNORMAL HIGH (ref 0–200)
HDL: 35 mg/dL — ABNORMAL LOW (ref >40)
LDL Cholesterol: 193 mg/dL — ABNORMAL HIGH (ref 0–99)
Total CHOL/HDL Ratio: 7.7 RATIO
Triglycerides: 204 mg/dL — ABNORMAL HIGH (ref <150)
VLDL: 41 mg/dL — ABNORMAL HIGH (ref 0–40)

## 2021-06-04 LAB — LACTIC ACID, PLASMA
Lactic Acid, Venous: 1.4 mmol/L (ref 0.5–1.9)
Lactic Acid, Venous: 1.3 mmol/L (ref 0.5–1.9)

## 2021-06-04 LAB — HIV ANTIBODY (ROUTINE TESTING W REFLEX): HIV Screen 4th Generation wRfx: NONREACTIVE

## 2021-06-04 LAB — LIPASE, BLOOD: Lipase: 1340 U/L — ABNORMAL HIGH (ref 11–51)

## 2021-06-04 LAB — BETA-HYDROXYBUTYRIC ACID
Beta-Hydroxybutyric Acid: 2.6 mmol/L — ABNORMAL HIGH (ref 0.05–0.27)
Beta-Hydroxybutyric Acid: 1.98 mmol/L — ABNORMAL HIGH (ref 0.05–0.27)

## 2021-06-04 LAB — TSH: TSH: 1.021 u[IU]/mL (ref 0.350–4.500)

## 2021-06-04 LAB — MRSA NEXT GEN BY PCR, NASAL: MRSA by PCR Next Gen: NOT DETECTED

## 2021-06-04 LAB — OSMOLALITY: Osmolality: 292 mOsm/kg (ref 275–295)

## 2021-06-04 MED ORDER — INSULIN ASPART 100 UNIT/ML IJ SOLN
0.0000 [IU] | Freq: Three times a day (TID) | INTRAMUSCULAR | Status: DC
Start: 2021-06-04 — End: 2021-06-08
  Administered 2021-06-04 – 2021-06-05 (×3): 5 [IU] via SUBCUTANEOUS
  Administered 2021-06-05: 7 [IU] via SUBCUTANEOUS
  Administered 2021-06-06 – 2021-06-07 (×5): 3 [IU] via SUBCUTANEOUS
  Administered 2021-06-07 – 2021-06-08 (×3): 2 [IU] via SUBCUTANEOUS

## 2021-06-04 MED ORDER — INSULIN STARTER KIT- PEN NEEDLES (ENGLISH): 1.0000 | Freq: Once | Status: DC

## 2021-06-04 MED ORDER — DEXTROSE IN LACTATED RINGERS 5 % IV SOLN: INTRAVENOUS | Status: DC

## 2021-06-04 MED ORDER — INSULIN GLARGINE-YFGN 100 UNIT/ML ~~LOC~~ SOLN
20.0000 [IU] | Freq: Every day | SUBCUTANEOUS | Status: DC
  Administered 2021-06-04: 20 [IU] via SUBCUTANEOUS
  Filled 2021-06-04 (×2): qty 0.2

## 2021-06-04 MED ORDER — ENOXAPARIN SODIUM 40 MG/0.4ML IJ SOSY
40.0000 mg | PREFILLED_SYRINGE | INTRAMUSCULAR | Status: DC
  Administered 2021-06-04 – 2021-06-07 (×4): 40 mg via SUBCUTANEOUS
  Filled 2021-06-04 (×4): qty 0.4

## 2021-06-04 MED ORDER — INSULIN STARTER KIT- PEN NEEDLES (ENGLISH)
1.0000 | Freq: Once | Status: AC
  Administered 2021-06-04: 1
  Filled 2021-06-04: qty 1

## 2021-06-04 MED ORDER — LIP MEDEX EX OINT
TOPICAL_OINTMENT | CUTANEOUS | Status: AC
  Administered 2021-06-04: 1
  Filled 2021-06-04: qty 7

## 2021-06-04 MED ORDER — LIVING WELL WITH DIABETES BOOK
Freq: Once | Status: AC
  Administered 2021-06-04: 1
  Filled 2021-06-04: qty 1

## 2021-06-04 MED ORDER — CHLORHEXIDINE GLUCONATE CLOTH 2 % EX PADS
6.0000 | MEDICATED_PAD | Freq: Every day | CUTANEOUS | Status: DC
  Administered 2021-06-04 – 2021-06-08 (×5): 6 via TOPICAL

## 2021-06-04 MED ORDER — LACTATED RINGERS IV SOLN: INTRAVENOUS | Status: DC

## 2021-06-04 MED ORDER — INSULIN ASPART 100 UNIT/ML IJ SOLN
0.0000 [IU] | Freq: Every day | INTRAMUSCULAR | Status: DC
  Administered 2021-06-04: 4 [IU] via SUBCUTANEOUS
  Administered 2021-06-05: 2 [IU] via SUBCUTANEOUS
  Administered 2021-06-06: 3 [IU] via SUBCUTANEOUS
  Administered 2021-06-07: 2 [IU] via SUBCUTANEOUS

## 2021-06-04 NOTE — Progress Notes (Signed)
NUTRITION NOTE  Consult received for education for patient with new dx of DM. Diet advanced from NPO to CLD today at 1325.   Patient laying in bed with no family or visitors present. Able to talk with RN and DM Coordinator at which time RN shares that patient's mentation is such that education is not appropriate at this time.  Communicated to RN and DM Coordinator that RD will place handouts in Discharge Instructions. RD is unable to enter referral for outpatient education due to patient not having insurance.   Will monitor for appropriateness for education prior to d/c.      Trenton Gammon, MS, RD, LDN, CNSC Inpatient Clinical Dietitian RD pager # available in AMION  After hours/weekend pager # available in [facility]

## 2021-06-04 NOTE — TOC Initial Note (Signed)
Transition of Care [facility]) - Initial/Assessment Note    Patient Details  Name: Edward Ware MRN: [id number] Date of Birth: 1986/10/15  Transition of Care [facility]) CM/SW Contact:    Golda Acre, RN Phone Number: 06/04/2021, 7:46 AM  Clinical Narrative:                 35 y.o. male with medical history significant of  obesity, tobacco abuse, alcohol abuse     Presented with   abdominal pain for the past 2 days  Associated with nausea Have been extra thirsty and urinating a lot for the past 5 days Stopped drinking 2 days ago NO CP, no cough    Patient reports he drinks about a pint a day and has been drinking for past 2 years He has quit in the past and denies history of alcohol withdrawal no seizures no hallucinations no shakes. Patient states his last EtOH was 48 hours ago and he is not experiencing any tremors currently   Has  NOt been vaccinated against COVID       Initial COVID TEST  in house  PCR testing  Pending-test is negative TOC PLAN OF CARE:  following for toc needs will need diabetic education by the coordinator Progression,iv insulin protocol. Bld glucose-210, anion gap -15, elevated lidpid panel. Expected Discharge Plan: Home/Self Care Barriers to Discharge: Continued Medical Work up   Patient Goals and CMS Choice        Expected Discharge Plan and Services Expected Discharge Plan: Home/Self Care   Discharge Planning Services: CM Consult   Living arrangements for the past 2 months: Single Family Home                                      Prior Living Arrangements/Services Living arrangements for the past 2 months: Single Family Home Lives with:: Self Patient language and need for interpreter reviewed:: Yes Do you feel safe going back to the place where you live?: Yes            Criminal Activity/Legal Involvement Pertinent to Current Situation/Hospitalization: No - Comment as needed  Activities of Daily Living Home Assistive  Devices/Equipment: None ADL Screening (condition at time of admission) Patient's cognitive ability adequate to safely complete daily activities?: Yes Is the patient deaf or have difficulty hearing?: No Does the patient have difficulty seeing, even when wearing glasses/contacts?: No Does the patient have difficulty concentrating, remembering, or making decisions?: No Patient able to express need for assistance with ADLs?: Yes Does the patient have difficulty dressing or bathing?: No Independently performs ADLs?: Yes (appropriate for developmental age) Does the patient have difficulty walking or climbing stairs?: No Weakness of Legs: Both Weakness of Arms/Hands: Both  Permission Sought/Granted                  Emotional Assessment Appearance:: Appears stated age     Orientation: : Oriented to Self, Oriented to Place, Oriented to  Time, Oriented to Situation Alcohol / Substance Use: Not Applicable Psych Involvement: No (comment)  Admission diagnosis:  DKA (diabetic ketoacidosis) (HCC) [E11.10] Diabetic ketoacidosis without coma associated with type 2 diabetes mellitus (HCC) [E11.10] Patient Active Problem List   Diagnosis Date Noted   DKA (diabetic ketoacidosis) (HCC) 06/03/2021   ETOH abuse 06/03/2021   Tobacco abuse 06/03/2021   Pancreatitis 06/03/2021   Dehydration 06/03/2021   PCP:  Patient, No Pcp Per (Inactive)  Pharmacy:   [facility] [phone] - [place], [place] - [address] AT [facility] OF [address] [place] [address] Phone: [phone] Fax: [phone]  [id number] - [place] - [address] AT [facility] [address]  [place] [address] Phone: [phone] Fax: [phone]     Social Determinants of Health (SDOH) Interventions    Readmission Risk Interventions No flowsheet data found.

## 2021-06-04 NOTE — Progress Notes (Signed)
Inpatient Diabetes Program Recommendations  AACE/ADA: New Consensus Statement on Inpatient Glycemic Control (2015)  Target Ranges:  Prepandial:   less than 140 mg/dL      Peak postprandial:   less than 180 mg/dL (1-2 hours)      Critically ill patients:  140 - 180 mg/dL   Lab Results  Component Value Date   GLUCAP 198 (H) 06/04/2021   HGBA1C 12.3 (H) 06/04/2021    Attempted to speak with patient at bedside and begin diabetes education.  He is not appropriate at this time for teaching per his RN.  Left ReliOn glucometer in room with belongings.  Will educate once appropriate.    Will continue to follow while inpatient.  Thank you, Dulce Sellar, RN, BSN Diabetes Coordinator Inpatient Diabetes Program [phone] (team pager from 8a-5p)

## 2021-06-04 NOTE — Discharge Instructions (Signed)

## 2021-06-04 NOTE — Progress Notes (Signed)
Inpatient Diabetes Program Recommendations  AACE/ADA: New Consensus Statement on Inpatient Glycemic Control (2015)  Target Ranges:  Prepandial:   less than 140 mg/dL      Peak postprandial:   less than 180 mg/dL (1-2 hours)      Critically ill patients:  140 - 180 mg/dL   Lab Results  Component Value Date   GLUCAP 299 (H) 06/04/2021   HGBA1C 12.3 (H) 06/04/2021    Review of Glycemic Control  Diabetes history: None Outpatient Diabetes medications: None Current orders for Inpatient glycemic control: IV insulin per Endo[employer] for DKA  New-onset DM with HgbA1C of 12.3%  Ordered Living Well with Diabetes book, Insulin pen starter kit and RD consult. Also ordered [facility] consult for assistance with obtaining PCP and medications as pt is not insured.  Inpatient Diabetes Program Recommendations:    Continue IV insulin until criteria met for discontinuation. Give Semglee 2H prior to discontinuation of drip. Consider: Lantus 15 units Q24H Novolog 0-9 units Q4H x 12H, then TID with meals and 0-5 HS Will likely need meal coverage insulin when eating CHO-mod diet - Novolog 4 units TID if eating > 50%.  Will teach Survival Skills and insulin pen administration prior to discharge.  Thank you. Lorenda Peck, RD, LDN, CDE Inpatient Diabetes Coordinator [phone]

## 2021-06-04 NOTE — Progress Notes (Signed)
[facility]  PROGRESS NOTE  Edward Ware WVP:[id number] DOB: Feb 09, 1986 DOA: 06/03/2021 PCP: Patient, No Pcp Per (Inactive)   Brief HPI:   35 year old male with history of obesity, tobacco abuse, alcohol abuse presented with abdominal pain for 2 days.  Lab work showed pancreatitis, confirmed on CT scan abdomen.  Lipase was elevated at 891, went up to 1340.  Patient also found to be in DKA, started on IV insulin.  Also started on CIWA protocol for alcohol withdrawal.    Subjective   Patient seen, drowsy this morning after he received Ativan as per CIWA protocol.   Assessment/Plan:   Diabetic ketoacidosis -Presented with DKA, blood glucose greater than 700; anion gap 21 -Started on IV insulin per DKA protocol -At this time CBG has been less than 250 g/dL since 5 AM this morning -Anion gap is 13; CO2 21 -We will discontinue IV insulin, start clear liquid diet and Lantus 20 subcu daily -We will start sliding scale insulin with NovoLog, check CBG every 4 hours -Continue to monitor BMP as ordered  Acute pancreatitis -Secondary to alcohol abuse -Lipid profile showed cholesterol 269, HDL 35, LDL 193, triglyceride 204 -CT abdomen/pelvis shows acute edematous pancreatitis; advanced hepatic steatosis and hepatomegaly -Lipase was initially at 891; went up to 1340 -Called and discussed with GI, Dr. Elnoria Howard, who says okay to start clear liquid diet. -Continue LR at 125 mill per hour for next 24 hours -Follow lipase in a.m.   Alcohol abuse -Started on Ativan per CIWA protocol -Continue thiamine, folate -Once more stable, will need social work consult for info for outpatient rehab   Scheduled medications:    Chlorhexidine Gluconate Cloth  6 each Topical Daily   folic acid  1 mg Oral Daily   insulin glargine-yfgn  20 Units Subcutaneous Daily   multivitamin with minerals  1 tablet Oral Daily   thiamine  100 mg Oral Daily   Or   thiamine  100 mg Intravenous Daily          Data Reviewed:   CBG:  Recent Labs  Lab 06/04/21 0845 06/04/21 0944 06/04/21 1043 06/04/21 1144 06/04/21 1242  GLUCAP 377* 299* 270* 249* 198*    SpO2: 93 %    Vitals:   06/04/21 0600 06/04/21 0700 06/04/21 0742 06/04/21 1248  BP: 119/79 (!) 147/88 130/90   Pulse: (!) 108 (!) 104 97   Resp: 20 (!) 21 15   Temp:   98 F (36.7 C) 97.8 F (36.6 C)  TempSrc:   Oral Oral  SpO2: 94% 94% 93%   Weight:      Height:         Intake/Output Summary (Last 24 hours) at 06/04/2021 1327 Last data filed at 06/04/2021 0744 Gross per 24 hour  Intake 2786.45 ml  Output 400 ml  Net 2386.45 ml    08/17 1901 - 08/19 0700 In: 2256.1 [I.V.:990.7] Out: 300 [Urine:300]  Filed Weights   06/03/21 1832 06/04/21 0132  Weight: 91 kg 101.1 kg    CBC:  Recent Labs  Lab 06/03/21 1837 06/03/21 1851  WBC 8.2  --   HGB 17.7* 19.0*  HCT 51.9 56.0*  PLT 265  --   MCV 88.9  --   MCH 30.3  --   MCHC 34.1  --   RDW 13.1  --   LYMPHSABS 1.1  --   MONOABS 0.7  --   EOSABS 0.1  --   BASOSABS 0.0  --     Complete  metabolic panel:  Recent Labs  Lab 06/03/21 1837 06/03/21 1851 06/03/21 2230 06/03/21 2232 06/04/21 0259 06/04/21 0456 06/04/21 0555 06/04/21 1252  NA 130* 126*  --  130* 132*  132*  --  133* 135  K 5.2* 5.2*  --  4.9 3.7  3.8  --  4.0 3.7  CL 88* 91*  --  94* 96*  97*  --  97* 101  CO2 21*  --   --  16* 21*  22  --  21* 21*  GLUCOSE 698* >700*  --  383* 208*  205*  --  210* 179*  BUN 18 21*  --  16 16  15   --  17 17  CREATININE 1.36* 1.10  --  1.27* 0.95  1.03  --  1.07 0.98  CALCIUM 9.8  --   --  9.7 9.6  9.7  --  9.7 9.9  AST 35  --   --   --  33  --   --   --   ALT 52*  --   --   --  48*  --   --   --   ALKPHOS 82  --   --   --  80  --   --   --   BILITOT 1.4*  --   --   --  1.2  --   --   --   ALBUMIN 4.8  --   --   --  4.6  --   --   --   MG  --   --   --  2.5*  --   --   --   --   LATICACIDVEN  --   --  2.4*  --  1.4 1.3  --   --   TSH  --   --    --   --  1.021  --   --   --   HGBA1C  --   --   --   --  12.3*  --   --   --     Recent Labs  Lab 06/03/21 1837 06/04/21 0259  LIPASE 891* 1,340*    Recent Labs  Lab 06/03/21 2105  SARSCOV2NAA NEGATIVE    ------------------------------------------------------------------------------------------------------------------ Recent Labs    06/04/21 0456  CHOL 269*  HDL 35*  LDLCALC 193*  TRIG 204*  CHOLHDL 7.7    Lab Results  Component Value Date   HGBA1C 12.3 (H) 06/04/2021   ------------------------------------------------------------------------------------------------------------------ Recent Labs    06/04/21 0259  TSH 1.021   ------------------------------------------------------------------------------------------------------------------ No results for input(s): VITAMINB12, FOLATE, FERRITIN, TIBC, IRON, RETICCTPCT in the last 72 hours.  Coagulation profile No results for input(s): INR, PROTIME in the last 168 hours. No results for input(s): DDIMER in the last 72 hours.  Cardiac Enzymes Recent Labs  Lab 06/03/21 2241  CKTOTAL 234    ------------------------------------------------------------------------------------------------------------------ No results found for: BNP   Antibiotics: Anti-infectives (From admission, onward)    None        Radiology Reports  CT ABDOMEN PELVIS WO CONTRAST  Result Date: 06/03/2021 CLINICAL DATA:  Diffuse abdominal pain with abdominal distension. Nausea. EXAM: CT ABDOMEN AND PELVIS WITHOUT CONTRAST TECHNIQUE: Multidetector CT imaging of the abdomen and pelvis was performed following the standard protocol without IV contrast. COMPARISON:  None. FINDINGS: Lower chest: The lung bases are clear.  The heart is normal in size. Hepatobiliary: Advanced hepatic steatosis with diffusely decreased hepatic density with areas of focal fatty sparing.  The liver is enlarged spanning 20.2 cm cranial caudal. There is no obvious focal  hepatic lesion on this unenhanced exam. Possible sludge in the gallbladder without calcified stone or pericholecystic inflammation. No biliary dilatation. Pancreas: Peripancreatic fat stranding with enlargement of the pancreatic head and uncinate process. Ill-defined stranding and fluid tracks into the right retroperitoneum in the anterior pararenal space. No pancreatic ductal dilatation. No acute peripancreatic collection. No parenchymal air. Spleen: Normal in size without focal abnormality. Adrenals/Urinary Tract: Normal adrenal glands. No hydronephrosis or renal calculi. No perinephric edema. No evidence of focal renal abnormality on this unenhanced exam. Partially distended urinary bladder Stomach/Bowel: Ingested material within the stomach. Mild wall thickening of the duodenum is felt to be reactive. Remainder of the small bowel is decompressed. Normal appendix. Small to moderate colonic stool burden. Vascular/Lymphatic: Normal caliber abdominal aorta. No aortic atherosclerosis. No portal venous or mesenteric gas. No abdominopelvic adenopathy. Reproductive: Prostate is unremarkable. Other: Peripancreatic fat stranding and inflammation with ill-defined free fluid tracking in the right retroperitoneum and anterior pararenal space. No organized collection or free air no abdominal wall hernia. Musculoskeletal: There are no acute or suspicious osseous abnormalities. There is a tiny vertebral body hemangioma within L4. Peripherally sclerotic lesion in the left inferior pubic ramus has a benign appearance. IMPRESSION: 1. Acute edematous pancreatitis. No acute peripancreatic collection or evidence of pancreatic necrosis on this unenhanced exam. 2. Advanced hepatic steatosis and hepatomegaly. Electronically Signed   By: Narda Rutherford M.D.   On: 06/03/2021 21:36   DG CHEST PORT 1 VIEW  Result Date: 06/03/2021 CLINICAL DATA:  DKA, upper abdominal pain EXAM: PORTABLE CHEST 1 VIEW COMPARISON:  None. FINDINGS:  Cardiomediastinal silhouette is within normal limits. There is no focal airspace disease. There is no large pleural effusion or visible pneumothorax. There is no acute osseous abnormality. IMPRESSION: No evidence of acute cardiopulmonary disease. Electronically Signed   By: Caprice Renshaw M.D.   On: 06/03/2021 21:21      DVT prophylaxis: Lovenox  Code Status: Full code  Family Communication: No family at bedside   Consultants:   Procedures:     Objective    Physical Examination:   General: Appears lethargic Cardiovascular: S1-S2, regular, no murmur auscultated Respiratory: Clear to auscultation bilaterally Abdomen: Abdomen is soft, nontender, no organomegaly Extremities: No edema in the lower extremities Neurologic: Alert, oriented x3, following commands, no focal deficit noted   Status is: Inpatient  Dispo: The patient is from: Home              Anticipated d/c is to: Home              Anticipated d/c date is: 06/06/2021              Patient currently not stable for discharge  Barrier to discharge-DKA, pancreatitis  COVID-19 Labs  No results for input(s): DDIMER, FERRITIN, LDH, CRP in the last 72 hours.  Lab Results  Component Value Date   SARSCOV2NAA NEGATIVE 06/03/2021    Microbiology  Recent Results (from the past 240 hour(s))  Resp Panel by RT-PCR (Flu A&B, Covid) Nasopharyngeal Swab     Status: None   Collection Time: 06/03/21  9:05 PM   Specimen: Nasopharyngeal Swab; Nasopharyngeal(NP) swabs in vial transport medium  Result Value Ref Range Status   SARS Coronavirus 2 by RT PCR NEGATIVE NEGATIVE Final    Comment: (NOTE) SARS-CoV-2 target nucleic acids are NOT DETECTED.  The SARS-CoV-2 RNA is generally detectable in upper respiratory specimens during the  acute phase of infection. The lowest concentration of SARS-CoV-2 viral copies this assay can detect is 138 copies/mL. A negative result does not preclude SARS-Cov-2 infection and should not be used  as the sole basis for treatment or other patient management decisions. A negative result may occur with  improper specimen collection/handling, submission of specimen other than nasopharyngeal swab, presence of viral mutation(s) within the areas targeted by this assay, and inadequate number of viral copies(<138 copies/mL). A negative result must be combined with clinical observations, patient history, and epidemiological information. The expected result is Negative.  Fact Sheet for Patients:  BloggerCourse.comhttps://www.fda.gov/media/152166/download  Fact Sheet for Healthcare Providers:  SeriousBroker.ithttps://www.fda.gov/media/152162/download  This test is no t yet approved or cleared by the [place] FDA and  has been authorized for detection and/or diagnosis of SARS-CoV-2 by FDA under an Emergency Use Authorization (EUA). This EUA will remain  in effect (meaning this test can be used) for the duration of the COVID-19 declaration under Section 564(b)(1) of the Act, 21 U.S.C.section 360bbb-3(b)(1), unless the authorization is terminated  or revoked sooner.       Influenza A by PCR NEGATIVE NEGATIVE Final   Influenza B by PCR NEGATIVE NEGATIVE Final    Comment: (NOTE) The Xpert Xpress SARS-CoV-2/FLU/RSV plus assay is intended as an aid in the diagnosis of influenza from Nasopharyngeal swab specimens and should not be used as a sole basis for treatment. Nasal washings and aspirates are unacceptable for Xpert Xpress SARS-CoV-2/FLU/RSV testing.  Fact Sheet for Patients: BloggerCourse.comhttps://www.fda.gov/media/152166/download  Fact Sheet for Healthcare Providers: SeriousBroker.ithttps://www.fda.gov/media/152162/download  This test is not yet approved or cleared by the [place] FDA and has been authorized for detection and/or diagnosis of SARS-CoV-2 by FDA under an Emergency Use Authorization (EUA). This EUA will remain in effect (meaning this test can be used) for the duration of the COVID-19 declaration under Section 564(b)(1)  of the Act, 21 U.S.C. section 360bbb-3(b)(1), unless the authorization is terminated or revoked.  Performed at [facility], [address] [place], [place] [address]   MRSA Next Gen by PCR, Nasal     Status: None   Collection Time: 06/04/21  1:38 AM   Specimen: Nasal Mucosa; Nasal Swab  Result Value Ref Range Status   MRSA by PCR Next Gen NOT DETECTED NOT DETECTED Final    Comment: (NOTE) The GeneXpert MRSA Assay (FDA approved for NASAL specimens only), is one component of a comprehensive MRSA colonization surveillance program. It is not intended to diagnose MRSA infection nor to guide or monitor treatment for MRSA infections. Test performance is not FDA approved in patients less than 457 years old. Performed at [facility], [address] [place], [place] [address]              Meredeth IdeGagan S Malyia Moro   [facility] If 7PM-7AM, please contact night-coverage at [email or web address], Office  [phone]   06/04/2021, 1:27 PM  LOS: 0 days

## 2021-06-05 ENCOUNTER — Inpatient Hospital Stay (HOSPITAL_COMMUNITY): Payer: Self-pay

## 2021-06-05 LAB — CBC
HCT: 53.2 % — ABNORMAL HIGH (ref 39.0–52.0)
Hemoglobin: 18.3 g/dL — ABNORMAL HIGH (ref 13.0–17.0)
MCH: 30.9 pg (ref 26.0–34.0)
MCHC: 34.4 g/dL (ref 30.0–36.0)
MCV: 89.7 fL (ref 80.0–100.0)
Platelets: 200 10*3/uL (ref 150–400)
RBC: 5.93 MIL/uL — ABNORMAL HIGH (ref 4.22–5.81)
RDW: 13.5 % (ref 11.5–15.5)
WBC: 13.8 10*3/uL — ABNORMAL HIGH (ref 4.0–10.5)
nRBC: 0 % (ref 0.0–0.2)

## 2021-06-05 LAB — COMPREHENSIVE METABOLIC PANEL
ALT: 34 U/L (ref 0–44)
AST: 41 U/L (ref 15–41)
Albumin: 3.6 g/dL (ref 3.5–5.0)
Alkaline Phosphatase: 66 U/L (ref 38–126)
Anion gap: 15 (ref 5–15)
BUN: 14 mg/dL (ref 6–20)
CO2: 21 mmol/L — ABNORMAL LOW (ref 22–32)
Calcium: 9.1 mg/dL (ref 8.9–10.3)
Chloride: 97 mmol/L — ABNORMAL LOW (ref 98–111)
Creatinine, Ser: 1.06 mg/dL (ref 0.61–1.24)
GFR, Estimated: >60 mL/min (ref >60)
Glucose, Bld: 318 mg/dL — ABNORMAL HIGH (ref 70–99)
Potassium: 4.6 mmol/L (ref 3.5–5.1)
Sodium: 133 mmol/L — ABNORMAL LOW (ref 135–145)
Total Bilirubin: 1.5 mg/dL — ABNORMAL HIGH (ref 0.3–1.2)
Total Protein: 7.3 g/dL (ref 6.5–8.1)

## 2021-06-05 LAB — GLUCOSE, CAPILLARY
Glucose-Capillary: 328 mg/dL — ABNORMAL HIGH (ref 70–99)
Glucose-Capillary: 318 mg/dL — ABNORMAL HIGH (ref 70–99)
Glucose-Capillary: 288 mg/dL — ABNORMAL HIGH (ref 70–99)
Glucose-Capillary: 254 mg/dL — ABNORMAL HIGH (ref 70–99)
Glucose-Capillary: 274 mg/dL — ABNORMAL HIGH (ref 70–99)
Glucose-Capillary: 234 mg/dL — ABNORMAL HIGH (ref 70–99)

## 2021-06-05 LAB — LIPASE, BLOOD: Lipase: 1380 U/L — ABNORMAL HIGH (ref 11–51)

## 2021-06-05 MED ORDER — OXYCODONE HCL 5 MG PO TABS
5.0000 mg | ORAL_TABLET | Freq: Four times a day (QID) | ORAL | Status: DC | PRN
  Administered 2021-06-05 – 2021-06-08 (×11): 5 mg via ORAL
  Filled 2021-06-05 (×11): qty 1

## 2021-06-05 MED ORDER — ACETAMINOPHEN 325 MG PO TABS
650.0000 mg | ORAL_TABLET | Freq: Four times a day (QID) | ORAL | Status: DC | PRN
  Administered 2021-06-06 (×2): 650 mg via ORAL
  Filled 2021-06-05 (×2): qty 2

## 2021-06-05 MED ORDER — INSULIN GLARGINE-YFGN 100 UNIT/ML ~~LOC~~ SOLN
25.0000 [IU] | Freq: Every day | SUBCUTANEOUS | Status: DC
  Administered 2021-06-05 – 2021-06-08 (×4): 25 [IU] via SUBCUTANEOUS
  Filled 2021-06-05 (×4): qty 0.25

## 2021-06-05 NOTE — Progress Notes (Addendum)
[facility]  PROGRESS NOTE  COULTER OLDAKER CBS:[id number] DOB: 1986/03/02 DOA: 06/03/2021 PCP: Patient, No Pcp Per (Inactive)   Brief HPI:   35 year old male with history of obesity, tobacco abuse, alcohol abuse disorder presented with abdominal pain for 2 days.  Lab work showed pancreatitis, confirmed on CT scan abdomen.  Lipase was elevated at 891, went up to 1340.  Patient also found to be in DKA, started on IV insulin.  Also started on CIWA protocol for alcohol withdrawal.  DKA resolved.  Transitioned off insulin drip/DKA protocol.    Subjective   Per nursing, poor historian and unsteady on his feet.  Reports no prior history of DM.  Positive family history of DM.  Abdominal pain 8/10, intermittent.  Small BM this morning.  Passing flatus.  Abdomen feels more distended.   Assessment/Plan:   Diabetic ketoacidosis a newly diagnosed type II DM -Presented with DKA, blood glucose greater than 700; anion gap 21 -Started on IV insulin per DKA protocol -After DKA resolved, was transitioned to glargine 20 units daily and NovoLog SSI. -CBGs persistently high, 300s range.  Thereby increased glargine to 25 units daily, consider adding mealtime NovoLog if he has consistent oral intake. -Diabetes coordinator input pending-unable to do due to mental status changes/lethargy yesterday. -A1c 12.3. -TOC consult regarding PCP needs and med assistance.  Since he does not have insurance, may have to consider switching him to Relion 70/30 insulin unless Lantus and NovoLog are covered-we will wait for TOC input.  Acute alcoholic pancreatitis -Secondary to alcohol abuse-volunteers to drinking a pint of Grey Goose vodka daily. -Lipid profile showed cholesterol 269, HDL 35, LDL 193, triglyceride 204 -CT abdomen/pelvis shows acute edematous pancreatitis; advanced hepatic steatosis and hepatomegaly -Lipase was initially at 891 >1 340 > 1380 -Dr. Sharl Ma called and discussed with GI, Dr. Elnoria Howard, who says  okay to start clear liquid diet.  Not to advance diet today. -Given dry mucosa, acute edematous pancreatitis, aggressive IV fluids, increase to 253mL/h. -Continue to trend daily lipase. -Due to concern for ileus, requested KUB, reviewed and no acute findings  Alcohol abuse disorder with alcohol withdrawal -Started on Ativan per CIWA protocol.  Elevated CIWA scores overnight 11 > 6 > 6 -Continue thiamine, folate -Once more stable, will need social work consult for info for outpatient rehab  Sinus tachycardia and elevated blood pressures Multifactorial due to acute pancreatitis, associated volume depletion and pain and alcohol withdrawal. Treat underlying causes as above and continue to monitor in stepdown on telemetry. Unable to rule out hypertension at this time.  As needed IV hydralazine for high blood pressures.    Scheduled medications:    Chlorhexidine Gluconate Cloth  6 each Topical Daily   enoxaparin (LOVENOX) injection  40 mg Subcutaneous Q24H   folic acid  1 mg Oral Daily   insulin aspart  0-5 Units Subcutaneous QHS   insulin aspart  0-9 Units Subcutaneous TID WC   insulin glargine-yfgn  25 Units Subcutaneous Daily   multivitamin with minerals  1 tablet Oral Daily   thiamine  100 mg Oral Daily   Or   thiamine  100 mg Intravenous Daily         Data Reviewed:   CBG:  Recent Labs  Lab 06/04/21 2017 06/04/21 2304 06/05/21 0341 06/05/21 0742 06/05/21 1136  GLUCAP 329* 310* 328* 318* 288*    SpO2: 93 %    Vitals:   06/05/21 0735 06/05/21 0800 06/05/21 0820 06/05/21 0826  BP: (!) 157/114 (!) 158/88 Marland Kitchen)  162/100   Pulse: (!) 135 (!) 130 (!) 133   Resp: (!) 28 (!) 32 (!) 26   Temp:    98.3 F (36.8 C)  TempSrc:    Oral  SpO2: 94% 91% 93%   Weight:      Height:         Intake/Output Summary (Last 24 hours) at 06/05/2021 1159 Last data filed at 06/05/2021 1100 Gross per 24 hour  Intake 3028.39 ml  Output 1150 ml  Net 1878.39 ml    08/18 1901 - 08/20  0700 In: 5632.8 [I.V.:4367.4] Out: 1250 [Urine:1250]  Filed Weights   06/03/21 1832 06/04/21 0132  Weight: 91 kg 101.1 kg    CBC:  Recent Labs  Lab 06/03/21 1837 06/03/21 1851 06/05/21 0235  WBC 8.2  --  13.8*  HGB 17.7* 19.0* 18.3*  HCT 51.9 56.0* 53.2*  PLT 265  --  200  MCV 88.9  --  89.7  MCH 30.3  --  30.9  MCHC 34.1  --  34.4  RDW 13.1  --  13.5  LYMPHSABS 1.1  --   --   MONOABS 0.7  --   --   EOSABS 0.1  --   --   BASOSABS 0.0  --   --     Complete metabolic panel:  Recent Labs  Lab 06/03/21 1837 06/03/21 1851 06/03/21 2230 06/03/21 2232 06/04/21 0259 06/04/21 0456 06/04/21 0555 06/04/21 1252 06/04/21 1711 06/05/21 0235  NA 130*   < >  --  130* 132*  132*  --  133* 135 132* 133*  K 5.2*   < >  --  4.9 3.7  3.8  --  4.0 3.7 4.1 4.6  CL 88*   < >  --  94* 96*  97*  --  97* 101 96* 97*  CO2 21*  --   --  16* 21*  22  --  21* 21* 23 21*  GLUCOSE 698*   < >  --  383* 208*  205*  --  210* 179* 240* 318*  BUN 18   < >  --  16 16  15   --  17 17 16 14   CREATININE 1.36*   < >  --  1.27* 0.95  1.03  --  1.07 0.98 1.09 1.06  CALCIUM 9.8  --   --  9.7 9.6  9.7  --  9.7 9.9 9.3 9.1  AST 35  --   --   --  33  --   --   --   --  41  ALT 52*  --   --   --  48*  --   --   --   --  34  ALKPHOS 82  --   --   --  80  --   --   --   --  66  BILITOT 1.4*  --   --   --  1.2  --   --   --   --  1.5*  ALBUMIN 4.8  --   --   --  4.6  --   --   --   --  3.6  MG  --   --   --  2.5*  --   --   --   --   --   --   LATICACIDVEN  --   --  2.4*  --  1.4 1.3  --   --   --   --  TSH  --   --   --   --  1.021  --   --   --   --   --   HGBA1C  --   --   --   --  12.3*  --   --   --   --   --    < > = values in this interval not displayed.    Recent Labs  Lab 06/03/21 1837 06/04/21 0259 06/05/21 0235  LIPASE 891* 1,340* 1,380*    Recent Labs  Lab 06/03/21 2105  SARSCOV2NAA NEGATIVE     ------------------------------------------------------------------------------------------------------------------ Recent Labs    06/04/21 0456  CHOL 269*  HDL 35*  LDLCALC 193*  TRIG 204*  CHOLHDL 7.7    Lab Results  Component Value Date   HGBA1C 12.3 (H) 06/04/2021   ------------------------------------------------------------------------------------------------------------------ Recent Labs    06/04/21 0259  TSH 1.021     Cardiac Enzymes Recent Labs  Lab 06/03/21 2241  CKTOTAL 234     Radiology Reports  CT ABDOMEN PELVIS WO CONTRAST  Result Date: 06/03/2021 CLINICAL DATA:  Diffuse abdominal pain with abdominal distension. Nausea. EXAM: CT ABDOMEN AND PELVIS WITHOUT CONTRAST TECHNIQUE: Multidetector CT imaging of the abdomen and pelvis was performed following the standard protocol without IV contrast. COMPARISON:  None. FINDINGS: Lower chest: The lung bases are clear.  The heart is normal in size. Hepatobiliary: Advanced hepatic steatosis with diffusely decreased hepatic density with areas of focal fatty sparing. The liver is enlarged spanning 20.2 cm cranial caudal. There is no obvious focal hepatic lesion on this unenhanced exam. Possible sludge in the gallbladder without calcified stone or pericholecystic inflammation. No biliary dilatation. Pancreas: Peripancreatic fat stranding with enlargement of the pancreatic head and uncinate process. Ill-defined stranding and fluid tracks into the right retroperitoneum in the anterior pararenal space. No pancreatic ductal dilatation. No acute peripancreatic collection. No parenchymal air. Spleen: Normal in size without focal abnormality. Adrenals/Urinary Tract: Normal adrenal glands. No hydronephrosis or renal calculi. No perinephric edema. No evidence of focal renal abnormality on this unenhanced exam. Partially distended urinary bladder Stomach/Bowel: Ingested material within the stomach. Mild wall thickening of the duodenum is felt  to be reactive. Remainder of the small bowel is decompressed. Normal appendix. Small to moderate colonic stool burden. Vascular/Lymphatic: Normal caliber abdominal aorta. No aortic atherosclerosis. No portal venous or mesenteric gas. No abdominopelvic adenopathy. Reproductive: Prostate is unremarkable. Other: Peripancreatic fat stranding and inflammation with ill-defined free fluid tracking in the right retroperitoneum and anterior pararenal space. No organized collection or free air no abdominal wall hernia. Musculoskeletal: There are no acute or suspicious osseous abnormalities. There is a tiny vertebral body hemangioma within L4. Peripherally sclerotic lesion in the left inferior pubic ramus has a benign appearance. IMPRESSION: 1. Acute edematous pancreatitis. No acute peripancreatic collection or evidence of pancreatic necrosis on this unenhanced exam. 2. Advanced hepatic steatosis and hepatomegaly. Electronically Signed   By: Narda Rutherford M.D.   On: 06/03/2021 21:36   DG CHEST PORT 1 VIEW  Result Date: 06/03/2021 CLINICAL DATA:  DKA, upper abdominal pain EXAM: PORTABLE CHEST 1 VIEW COMPARISON:  None. FINDINGS: Cardiomediastinal silhouette is within normal limits. There is no focal airspace disease. There is no large pleural effusion or visible pneumothorax. There is no acute osseous abnormality. IMPRESSION: No evidence of acute cardiopulmonary disease. Electronically Signed   By: Caprice Renshaw M.D.   On: 06/03/2021 21:21   DG Abd Portable 1V  Result Date: 06/05/2021 CLINICAL DATA:  Abdominal pain with pancreatitis EXAM: PORTABLE ABDOMEN - 1 VIEW COMPARISON:  06/03/2021 CT FINDINGS: Single supine view of the abdomen and pelvis demonstrates a nonobstructive bowel-gas pattern. No abnormal abdominal calcifications. No appendicolith. No gross free intraperitoneal air. IMPRESSION: No acute findings. Electronically Signed   By: Jeronimo GreavesKyle  Talbot M.D.   On: 06/05/2021 08:53      DVT prophylaxis:  Lovenox  Code Status: Full code  Family Communication: No family at bedside   Consultants: None  Procedures: None    Objective    Physical Examination:  General exam: Male, moderately built and obese lying propped up in bed.  Somewhat uncomfortable.  Oral mucosa dry. Respiratory system: Clear to auscultation. Respiratory effort normal. Cardiovascular system: S1 & S2 heard, regular tachycardic. No JVD, murmurs, rubs, gallops or clicks. No pedal edema.  Telemetry personally reviewed: Sinus tachycardia in the 130s. Gastrointestinal system: Abdomen is obese or slightly distended, mild epigastric/periumbilical tenderness without rigidity, guarding or rebound.  No organomegaly or masses appreciated.  Normal bowel sounds heard. Central nervous system: Alert and oriented. No focal neurological deficits. Extremities: Symmetric 5 x 5 power. Skin: No rashes, lesions or ulcers Psychiatry: Judgement and insight appear somewhat impaired but meeting him for the very first time.  Also got a dose of Ativan overnight. Mood & affect flat.    Status is: Inpatient  Dispo: The patient is from: Home              Anticipated d/c is to: Home              Anticipated d/c date is: 2 to 3 days.              Patient currently not stable for discharge  Barrier to discharge-DKA, pancreatitis  COVID-19 Labs  No results for input(s): DDIMER, FERRITIN, LDH, CRP in the last 72 hours.  Lab Results  Component Value Date   SARSCOV2NAA NEGATIVE 06/03/2021    Microbiology  Recent Results (from the past 240 hour(s))  Resp Panel by RT-PCR (Flu A&B, Covid) Nasopharyngeal Swab     Status: None   Collection Time: 06/03/21  9:05 PM   Specimen: Nasopharyngeal Swab; Nasopharyngeal(NP) swabs in vial transport medium  Result Value Ref Range Status   SARS Coronavirus 2 by RT PCR NEGATIVE NEGATIVE Final    Comment: (NOTE) SARS-CoV-2 target nucleic acids are NOT DETECTED.  The SARS-CoV-2 RNA is generally  detectable in upper respiratory specimens during the acute phase of infection. The lowest concentration of SARS-CoV-2 viral copies this assay can detect is 138 copies/mL. A negative result does not preclude SARS-Cov-2 infection and should not be used as the sole basis for treatment or other patient management decisions. A negative result may occur with  improper specimen collection/handling, submission of specimen other than nasopharyngeal swab, presence of viral mutation(s) within the areas targeted by this assay, and inadequate number of viral copies(<138 copies/mL). A negative result must be combined with clinical observations, patient history, and epidemiological information. The expected result is Negative.  Fact Sheet for Patients:  BloggerCourse.comhttps://www.fda.gov/media/152166/download  Fact Sheet for Healthcare Providers:  SeriousBroker.ithttps://www.fda.gov/media/152162/download  This test is no t yet approved or cleared by the [place] FDA and  has been authorized for detection and/or diagnosis of SARS-CoV-2 by FDA under an Emergency Use Authorization (EUA). This EUA will remain  in effect (meaning this test can be used) for the duration of the COVID-19 declaration under Section 564(b)(1) of the Act, 21 U.S.C.section 360bbb-3(b)(1), unless the authorization is terminated  or revoked sooner.       Influenza A by PCR NEGATIVE NEGATIVE Final   Influenza B by PCR NEGATIVE NEGATIVE Final    Comment: (NOTE) The Xpert Xpress SARS-CoV-2/FLU/RSV plus assay is intended as an aid in the diagnosis of influenza from Nasopharyngeal swab specimens and should not be used as a sole basis for treatment. Nasal washings and aspirates are unacceptable for Xpert Xpress SARS-CoV-2/FLU/RSV testing.  Fact Sheet for Patients: BloggerCourse.com  Fact Sheet for Healthcare Providers: SeriousBroker.it  This test is not yet approved or cleared by the [place] FDA  and has been authorized for detection and/or diagnosis of SARS-CoV-2 by FDA under an Emergency Use Authorization (EUA). This EUA will remain in effect (meaning this test can be used) for the duration of the COVID-19 declaration under Section 564(b)(1) of the Act, 21 U.S.C. section 360bbb-3(b)(1), unless the authorization is terminated or revoked.  Performed at [facility], [address], [place], [place] [address]   MRSA Next Gen by PCR, Nasal     Status: None   Collection Time: 06/04/21  1:38 AM   Specimen: Nasal Mucosa; Nasal Swab  Result Value Ref Range Status   MRSA by PCR Next Gen NOT DETECTED NOT DETECTED Final    Comment: (NOTE) The GeneXpert MRSA Assay (FDA approved for NASAL specimens only), is one component of a comprehensive MRSA colonization surveillance program. It is not intended to diagnose MRSA infection nor to guide or monitor treatment for MRSA infections. Test performance is not FDA approved in patients less than 60 years old. Performed at [facility], [address], [place], [place] [address]             Marcellus Scott, MD, FACP, [facility]. [facility]  To contact the attending provider between 7A-7P or the covering provider during after hours 7P-7A, please log into the web site [email or web address] and access using universal [place] password for that web site. If you do not have the password, please call the hospital operator.    06/05/2021, 11:59 AM  LOS: 1 day

## 2021-06-06 ENCOUNTER — Encounter (HOSPITAL_COMMUNITY): Payer: Self-pay

## 2021-06-06 LAB — CBC
HCT: 44 % (ref 39.0–52.0)
Hemoglobin: 15 g/dL (ref 13.0–17.0)
MCH: 30.9 pg (ref 26.0–34.0)
MCHC: 34.1 g/dL (ref 30.0–36.0)
MCV: 90.7 fL (ref 80.0–100.0)
Platelets: 172 10*3/uL (ref 150–400)
RBC: 4.85 MIL/uL (ref 4.22–5.81)
RDW: 13.9 % (ref 11.5–15.5)
WBC: 14.3 10*3/uL — ABNORMAL HIGH (ref 4.0–10.5)
nRBC: 0 % (ref 0.0–0.2)

## 2021-06-06 LAB — COMPREHENSIVE METABOLIC PANEL
ALT: 23 U/L (ref 0–44)
AST: 30 U/L (ref 15–41)
Albumin: 2.8 g/dL — ABNORMAL LOW (ref 3.5–5.0)
Alkaline Phosphatase: 60 U/L (ref 38–126)
Anion gap: 9 (ref 5–15)
BUN: 11 mg/dL (ref 6–20)
CO2: 23 mmol/L (ref 22–32)
Calcium: 7.8 mg/dL — ABNORMAL LOW (ref 8.9–10.3)
Chloride: 98 mmol/L (ref 98–111)
Creatinine, Ser: 0.88 mg/dL (ref 0.61–1.24)
GFR, Estimated: >60 mL/min (ref >60)
Glucose, Bld: 217 mg/dL — ABNORMAL HIGH (ref 70–99)
Potassium: 3.7 mmol/L (ref 3.5–5.1)
Sodium: 130 mmol/L — ABNORMAL LOW (ref 135–145)
Total Bilirubin: 1.3 mg/dL — ABNORMAL HIGH (ref 0.3–1.2)
Total Protein: 6 g/dL — ABNORMAL LOW (ref 6.5–8.1)

## 2021-06-06 LAB — GLUCOSE, CAPILLARY
Glucose-Capillary: 212 mg/dL — ABNORMAL HIGH (ref 70–99)
Glucose-Capillary: 232 mg/dL — ABNORMAL HIGH (ref 70–99)
Glucose-Capillary: 220 mg/dL — ABNORMAL HIGH (ref 70–99)
Glucose-Capillary: 283 mg/dL — ABNORMAL HIGH (ref 70–99)

## 2021-06-06 LAB — LIPASE, BLOOD: Lipase: 472 U/L — ABNORMAL HIGH (ref 11–51)

## 2021-06-06 MED ORDER — OXYCODONE HCL 5 MG PO TABS
5.0000 mg | ORAL_TABLET | Freq: Once | ORAL | Status: AC
  Administered 2021-06-06: 5 mg via ORAL
  Filled 2021-06-06: qty 1

## 2021-06-06 MED ORDER — SIMETHICONE 80 MG PO CHEW
80.0000 mg | CHEWABLE_TABLET | Freq: Four times a day (QID) | ORAL | Status: DC
  Administered 2021-06-06 – 2021-06-08 (×10): 80 mg via ORAL
  Filled 2021-06-06 (×10): qty 1

## 2021-06-06 MED ORDER — HYDROMORPHONE HCL 1 MG/ML IJ SOLN
1.0000 mg | INTRAMUSCULAR | Status: DC | PRN
  Administered 2021-06-06 – 2021-06-08 (×13): 1 mg via INTRAVENOUS
  Filled 2021-06-06 (×13): qty 1

## 2021-06-06 MED ORDER — HYDRALAZINE HCL 20 MG/ML IJ SOLN
5.0000 mg | INTRAMUSCULAR | Status: DC | PRN
  Administered 2021-06-06: 5 mg via INTRAVENOUS
  Filled 2021-06-06: qty 1

## 2021-06-06 NOTE — Progress Notes (Signed)
NP aware pt also in intense pain. Msg regarding changing frequency of oxy. NP ordered 1 x dose of oxy. Tylenol given for pain and fever. Will continue vs every 4 hours   06/06/21 2028  Assess: MEWS Score  Temp (!) 100.8 F (38.2 C)  BP 138/79  Pulse Rate (!) 128  Resp 20  SpO2 97 %  O2 Device Room Air  Assess: MEWS Score  MEWS Temp 1  MEWS Systolic 0  MEWS Pulse 2  MEWS RR 0  MEWS LOC 0  MEWS Score 3  MEWS Score Color Yellow  Assess: if the MEWS score is Yellow or Red  Were vital signs taken at a resting state? Yes  Focused Assessment No change from prior assessment  Does the patient meet 2 or more of the SIRS criteria? No  MEWS guidelines implemented *See Row Information* No, previously yellow, continue vital signs every 4 hours  Take Vital Signs  Increase Vital Sign Frequency   (continue q4 vitals)  Escalate  MEWS: Escalate Yellow: discuss with charge nurse/RN and consider discussing with provider and RRT (previously yellow)  Notify: Charge Nurse/RN  Name of Charge Nurse/RN Notified Hilda RN  Date Charge Nurse/RN Notified 06/06/21  Time Charge Nurse/RN Notified 2034  Notify: Provider  Provider Name/Title Linton Flemings NP  Date Provider Notified 06/06/21  Time Provider Notified 2036  Notification Type  (AMion)  Notification Reason Other (Comment) (Continued yellow upon transfer from ICU)  Assess: SIRS CRITERIA  SIRS Temperature  0  SIRS Pulse 1  SIRS Respirations  0  SIRS WBC 0  SIRS Score Sum  1

## 2021-06-06 NOTE — Progress Notes (Signed)
[facility]  PROGRESS NOTE  Edward Ware STM:[id number] DOB: [birth year] DOA: 06/03/2021 PCP: Patient, No Pcp Per (Inactive)   Brief HPI:   35 year old male with history of obesity, tobacco abuse, alcohol abuse presented with abdominal pain for 2 days.  Lab work showed pancreatitis, confirmed on CT scan abdomen.  Lipase was elevated at 891, went up to 1340.  Patient also found to be in DKA, started on IV insulin.  Also started on CIWA protocol for alcohol withdrawal.    Subjective   Patient seen and examined, still complains of abdominal pain.  Lipase down to 472.   Assessment/Plan:   Diabetic ketoacidosis/new onset diabetes mellitus type 2 -Presented with DKA, blood glucose greater than 700; anion gap 21 -Started on IV insulin per DKA protocol -DKA resolved; IV insulin was discontinued.  Patient was started on Lantus and sliding scale insulin -Lantus dose increased to 25 units subcu daily, as CBG was elevated -CBG still elevated in 200s; will add NovoLog 3 units 3 times daily meal coverage  Acute pancreatitis -Secondary to alcohol abuse -Lipid profile showed cholesterol 269, HDL 35, LDL 193, triglyceride 204 -CT abdomen/pelvis shows acute edematous pancreatitis; advanced hepatic steatosis and hepatomegaly -Lipase was initially at 891; went up to 1340.  Today lipase down to 472 -Patient was started on clear liquid diet after discussion with Dr. Elnoria Howard, GI.   -Continue LR at 200cc/h   Alcohol abuse -Started on Ativan per CIWA protocol -Continue thiamine, folate -Once more stable, will need social work consult for info for outpatient rehab  Elevated blood pressure -Likely from pain due to acute pancreatitis and alcohol withdrawal -Continue IV hydralazine as needed   Scheduled medications:    Chlorhexidine Gluconate Cloth  6 each Topical Daily   enoxaparin (LOVENOX) injection  40 mg Subcutaneous Q24H   folic acid  1 mg Oral Daily   insulin aspart  0-5 Units  Subcutaneous QHS   insulin aspart  0-9 Units Subcutaneous TID WC   insulin glargine-yfgn  25 Units Subcutaneous Daily   multivitamin with minerals  1 tablet Oral Daily   simethicone  80 mg Oral QID   thiamine  100 mg Oral Daily   Or   thiamine  100 mg Intravenous Daily         Data Reviewed:   CBG:  Recent Labs  Lab 06/05/21 1651 06/05/21 1941 06/05/21 2157 06/06/21 0828 06/06/21 1139  GLUCAP 254* 274* 234* 212* 232*    SpO2: 95 %    Vitals:   06/06/21 0410 06/06/21 0604 06/06/21 0800 06/06/21 0902  BP: 137/83 (!) 146/92 (!) 130/91   Pulse: (!) 130 (!) 129 (!) 126   Resp: 20 (!) 22 (!) 27   Temp: 98.2 F (36.8 C)   100.3 F (37.9 C)  TempSrc: Oral   Oral  SpO2: 97% 93% 95%   Weight: 110.8 kg     Height:         Intake/Output Summary (Last 24 hours) at 06/06/2021 1224 Last data filed at 06/06/2021 0900 Gross per 24 hour  Intake 4539.65 ml  Output 1560 ml  Net 2979.65 ml    08/19 1901 - 08/21 0700 In: 5947.2 [P.O.:480; I.V.:5467.2] Out: 2710 [Urine:2710]  Filed Weights   06/03/21 1832 06/04/21 0132 06/06/21 0410  Weight: 91 kg 101.1 kg 110.8 kg    CBC:  Recent Labs  Lab 06/03/21 1837 06/03/21 1851 06/05/21 0235 06/06/21 0300  WBC 8.2  --  13.8* 14.3*  HGB 17.7* 19.0* 18.3*  15.0  HCT 51.9 56.0* 53.2* 44.0  PLT 265  --  200 172  MCV 88.9  --  89.7 90.7  MCH 30.3  --  30.9 30.9  MCHC 34.1  --  34.4 34.1  RDW 13.1  --  13.5 13.9  LYMPHSABS 1.1  --   --   --   MONOABS 0.7  --   --   --   EOSABS 0.1  --   --   --   BASOSABS 0.0  --   --   --     Complete metabolic panel:  Recent Labs  Lab 06/03/21 1837 06/03/21 1851 06/03/21 2230 06/03/21 2232 06/04/21 0259 06/04/21 0456 06/04/21 0555 06/04/21 1252 06/04/21 1711 06/05/21 0235 06/06/21 0300  NA 130*   < >  --  130* 132*  132*  --  133* 135 132* 133* 130*  K 5.2*   < >  --  4.9 3.7  3.8  --  4.0 3.7 4.1 4.6 3.7  CL 88*   < >  --  94* 96*  97*  --  97* 101 96* 97* 98  CO2  21*  --   --  16* 21*  22  --  21* 21* 23 21* 23  GLUCOSE 698*   < >  --  383* 208*  205*  --  210* 179* 240* 318* 217*  BUN 18   < >  --  16 16  15   --  17 17 16 14 11   CREATININE 1.36*   < >  --  1.27* 0.95  1.03  --  1.07 0.98 1.09 1.06 0.88  CALCIUM 9.8  --   --  9.7 9.6  9.7  --  9.7 9.9 9.3 9.1 7.8*  AST 35  --   --   --  33  --   --   --   --  41 30  ALT 52*  --   --   --  48*  --   --   --   --  34 23  ALKPHOS 82  --   --   --  80  --   --   --   --  66 60  BILITOT 1.4*  --   --   --  1.2  --   --   --   --  1.5* 1.3*  ALBUMIN 4.8  --   --   --  4.6  --   --   --   --  3.6 2.8*  MG  --   --   --  2.5*  --   --   --   --   --   --   --   LATICACIDVEN  --   --  2.4*  --  1.4 1.3  --   --   --   --   --   TSH  --   --   --   --  1.021  --   --   --   --   --   --   HGBA1C  --   --   --   --  12.3*  --   --   --   --   --   --    < > = values in this interval not displayed.    Recent Labs  Lab 06/03/21 1837 06/04/21 0259 06/05/21 0235 06/06/21 0300  LIPASE 891* 1,340* 1,380* 472*  Recent Labs  Lab 06/03/21 2105  SARSCOV2NAA NEGATIVE    ------------------------------------------------------------------------------------------------------------------ Recent Labs    06/04/21 0456  CHOL 269*  HDL 35*  LDLCALC 193*  TRIG 204*  CHOLHDL 7.7    Lab Results  Component Value Date   HGBA1C 12.3 (H) 06/04/2021   ------------------------------------------------------------------------------------------------------------------ Recent Labs    06/04/21 0259  TSH 1.021   ------------------------------------------------------------------------------------------------------------------ No results for input(s): VITAMINB12, FOLATE, FERRITIN, TIBC, IRON, RETICCTPCT in the last 72 hours.  Coagulation profile No results for input(s): INR, PROTIME in the last 168 hours. No results for input(s): DDIMER in the last 72 hours.  Cardiac Enzymes Recent Labs  Lab  06/03/21 2241  CKTOTAL 234    ------------------------------------------------------------------------------------------------------------------ No results found for: BNP   Antibiotics: Anti-infectives (From admission, onward)    None        Radiology Reports  DG Abd Portable 1V  Result Date: 06/05/2021 CLINICAL DATA:  Abdominal pain with pancreatitis EXAM: PORTABLE ABDOMEN - 1 VIEW COMPARISON:  06/03/2021 CT FINDINGS: Single supine view of the abdomen and pelvis demonstrates a nonobstructive bowel-gas pattern. No abnormal abdominal calcifications. No appendicolith. No gross free intraperitoneal air. IMPRESSION: No acute findings. Electronically Signed   By: Jeronimo Greaves M.D.   On: 06/05/2021 08:53      DVT prophylaxis: Lovenox  Code Status: Full code  Family Communication: No family at bedside   Consultants:   Procedures:     Objective    Physical Examination:   General-appears in no acute distress Heart-S1-S2, regular, no murmur auscultated Lungs-clear to auscultation bilaterally, no wheezing or crackles auscultated Abdomen-soft, mild abdominal distention, positive epigastric tenderness to palpation Extremities-no edema in the lower extremities Neuro-alert, oriented x3, no focal deficit noted   Status is: Inpatient  Dispo: The patient is from: Home              Anticipated d/c is to: Home              Anticipated d/c date is: 06/08/2021              Patient currently not stable for discharge  Barrier to discharge-DKA, pancreatitis  COVID-19 Labs  No results for input(s): DDIMER, FERRITIN, LDH, CRP in the last 72 hours.  Lab Results  Component Value Date   SARSCOV2NAA NEGATIVE 06/03/2021    Microbiology  Recent Results (from the past 240 hour(s))  Resp Panel by RT-PCR (Flu A&B, Covid) Nasopharyngeal Swab     Status: None   Collection Time: 06/03/21  9:05 PM   Specimen: Nasopharyngeal Swab; Nasopharyngeal(NP) swabs in vial transport medium   Result Value Ref Range Status   SARS Coronavirus 2 by RT PCR NEGATIVE NEGATIVE Final    Comment: (NOTE) SARS-CoV-2 target nucleic acids are NOT DETECTED.  The SARS-CoV-2 RNA is generally detectable in upper respiratory specimens during the acute phase of infection. The lowest concentration of SARS-CoV-2 viral copies this assay can detect is 138 copies/mL. A negative result does not preclude SARS-Cov-2 infection and should not be used as the sole basis for treatment or other patient management decisions. A negative result may occur with  improper specimen collection/handling, submission of specimen other than nasopharyngeal swab, presence of viral mutation(s) within the areas targeted by this assay, and inadequate number of viral copies(<138 copies/mL). A negative result must be combined with clinical observations, patient history, and epidemiological information. The expected result is Negative.  Fact Sheet for Patients:  BloggerCourse.com  Fact Sheet for Healthcare Providers:  SeriousBroker.it  This test  is no t yet approved or cleared by the [place] FDA and  has been authorized for detection and/or diagnosis of SARS-CoV-2 by FDA under an Emergency Use Authorization (EUA). This EUA will remain  in effect (meaning this test can be used) for the duration of the COVID-19 declaration under Section 564(b)(1) of the Act, 21 U.S.C.section 360bbb-3(b)(1), unless the authorization is terminated  or revoked sooner.       Influenza A by PCR NEGATIVE NEGATIVE Final   Influenza B by PCR NEGATIVE NEGATIVE Final    Comment: (NOTE) The Xpert Xpress SARS-CoV-2/FLU/RSV plus assay is intended as an aid in the diagnosis of influenza from Nasopharyngeal swab specimens and should not be used as a sole basis for treatment. Nasal washings and aspirates are unacceptable for Xpert Xpress SARS-CoV-2/FLU/RSV testing.  Fact Sheet for  Patients: BloggerCourse.comhttps://www.fda.gov/media/152166/download  Fact Sheet for Healthcare Providers: SeriousBroker.ithttps://www.fda.gov/media/152162/download  This test is not yet approved or cleared by the [place] FDA and has been authorized for detection and/or diagnosis of SARS-CoV-2 by FDA under an Emergency Use Authorization (EUA). This EUA will remain in effect (meaning this test can be used) for the duration of the COVID-19 declaration under Section 564(b)(1) of the Act, 21 U.S.C. section 360bbb-3(b)(1), unless the authorization is terminated or revoked.  Performed at [facility], [address] [place], [place] [address]   MRSA Next Gen by PCR, Nasal     Status: None   Collection Time: 06/04/21  1:38 AM   Specimen: Nasal Mucosa; Nasal Swab  Result Value Ref Range Status   MRSA by PCR Next Gen NOT DETECTED NOT DETECTED Final    Comment: (NOTE) The GeneXpert MRSA Assay (FDA approved for NASAL specimens only), is one component of a comprehensive MRSA colonization surveillance program. It is not intended to diagnose MRSA infection nor to guide or monitor treatment for MRSA infections. Test performance is not FDA approved in patients less than 34 years old. Performed at [facility], [address] [place], [place] [address]              Meredeth IdeGagan S Noretta Frier   [facility] If 7PM-7AM, please contact night-coverage at [email or web address], Office  [phone]   06/06/2021, 12:24 PM  LOS: 2 days

## 2021-06-07 LAB — COMPREHENSIVE METABOLIC PANEL
ALT: 21 U/L (ref 0–44)
AST: 21 U/L (ref 15–41)
Albumin: 2.6 g/dL — ABNORMAL LOW (ref 3.5–5.0)
Alkaline Phosphatase: 63 U/L (ref 38–126)
Anion gap: 10 (ref 5–15)
BUN: 7 mg/dL (ref 6–20)
CO2: 24 mmol/L (ref 22–32)
Calcium: 8.1 mg/dL — ABNORMAL LOW (ref 8.9–10.3)
Chloride: 100 mmol/L (ref 98–111)
Creatinine, Ser: 0.76 mg/dL (ref 0.61–1.24)
GFR, Estimated: >60 mL/min (ref >60)
Glucose, Bld: 218 mg/dL — ABNORMAL HIGH (ref 70–99)
Potassium: 3.8 mmol/L (ref 3.5–5.1)
Sodium: 134 mmol/L — ABNORMAL LOW (ref 135–145)
Total Bilirubin: 1.4 mg/dL — ABNORMAL HIGH (ref 0.3–1.2)
Total Protein: 6 g/dL — ABNORMAL LOW (ref 6.5–8.1)

## 2021-06-07 LAB — GLUCOSE, CAPILLARY
Glucose-Capillary: 195 mg/dL — ABNORMAL HIGH (ref 70–99)
Glucose-Capillary: 227 mg/dL — ABNORMAL HIGH (ref 70–99)
Glucose-Capillary: 213 mg/dL — ABNORMAL HIGH (ref 70–99)
Glucose-Capillary: 237 mg/dL — ABNORMAL HIGH (ref 70–99)

## 2021-06-07 LAB — CBC
HCT: 37.7 % — ABNORMAL LOW (ref 39.0–52.0)
Hemoglobin: 12.6 g/dL — ABNORMAL LOW (ref 13.0–17.0)
MCH: 30.6 pg (ref 26.0–34.0)
MCHC: 33.4 g/dL (ref 30.0–36.0)
MCV: 91.5 fL (ref 80.0–100.0)
Platelets: 187 10*3/uL (ref 150–400)
RBC: 4.12 MIL/uL — ABNORMAL LOW (ref 4.22–5.81)
RDW: 14.1 % (ref 11.5–15.5)
WBC: 10.8 10*3/uL — ABNORMAL HIGH (ref 4.0–10.5)
nRBC: 0 % (ref 0.0–0.2)

## 2021-06-07 LAB — LIPASE, BLOOD: Lipase: 100 U/L — ABNORMAL HIGH (ref 11–51)

## 2021-06-07 MED ORDER — POLYETHYLENE GLYCOL 3350 17 G PO PACK
17.0000 g | PACK | Freq: Every day | ORAL | Status: DC
  Administered 2021-06-07: 17 g via ORAL
  Filled 2021-06-07 (×2): qty 1

## 2021-06-07 NOTE — Progress Notes (Signed)
[facility]  PROGRESS NOTE  MERIL DRAY OEH:[id number] DOB: [birth year] DOA: 06/03/2021 PCP: Patient, No Pcp Per (Inactive)   Brief HPI:   35 year old male with history of obesity, tobacco abuse, alcohol abuse presented with abdominal pain for 2 days.  Lab work showed pancreatitis, confirmed on CT scan abdomen.  Lipase was elevated at 891, went up to 1340.  Patient also found to be in DKA, started on IV insulin.  Also started on CIWA protocol for alcohol withdrawal.    Subjective   Feeling better, wants to eat solid food. Lipase is 100 this morning.   Assessment/Plan:   Diabetic ketoacidosis/new onset diabetes mellitus type 2 -Presented with DKA, blood glucose greater than 700; anion gap 21 -Started on IV insulin per DKA protocol -DKA resolved; IV insulin was discontinued.  Patient was started on Lantus and sliding scale insulin -Lantus dose increased to 25 units subcu daily, as CBG was elevated -CBG has improved after starting  NovoLog 3 units 3 times daily meal coverage  Acute pancreatitis -Secondary to alcohol abuse -Lipid profile showed cholesterol 269, HDL 35, LDL 193, triglyceride 204 -CT abdomen/pelvis shows acute edematous pancreatitis; advanced hepatic steatosis and hepatomegaly -Lipase was initially at 891; went up to 1340.  Today lipase down to 100 -Patient was started on clear liquid diet after discussion with Dr. Elnoria Howard, GI.  -We will advance diet to soft diet - Cut down LR to 100 ml/hr    Alcohol abuse -Started on Ativan per CIWA protocol -Continue thiamine, folate -We will get social work consult for outpatient rehab  Elevated blood pressure -Likely from pain due to acute pancreatitis and alcohol withdrawal -Continue IV hydralazine as needed   Scheduled medications:    Chlorhexidine Gluconate Cloth  6 each Topical Daily   enoxaparin (LOVENOX) injection  40 mg Subcutaneous Q24H   folic acid  1 mg Oral Daily   insulin aspart  0-5 Units  Subcutaneous QHS   insulin aspart  0-9 Units Subcutaneous TID WC   insulin glargine-yfgn  25 Units Subcutaneous Daily   multivitamin with minerals  1 tablet Oral Daily   simethicone  80 mg Oral QID   thiamine  100 mg Oral Daily   Or   thiamine  100 mg Intravenous Daily         Data Reviewed:   CBG:  Recent Labs  Lab 06/06/21 0828 06/06/21 1139 06/06/21 1615 06/06/21 2027 06/07/21 0754  GLUCAP 212* 232* 220* 283* 195*    SpO2: 99 %    Vitals:   06/06/21 1937 06/06/21 2028 06/07/21 0014 06/07/21 0441  BP: 120/75 138/79 (!) 157/92 (!) 141/84  Pulse: (!) 132 (!) 128 (!) 119 (!) 107  Resp: 20 20 20 20   Temp:  (!) 100.8 F (38.2 C) 98.4 F (36.9 C) 98.6 F (37 C)  TempSrc:  Oral Oral Oral  SpO2: 97% 97% 97% 99%  Weight:    110.3 kg  Height:         Intake/Output Summary (Last 24 hours) at 06/07/2021 0900 Last data filed at 06/07/2021 0840 Gross per 24 hour  Intake 3504.08 ml  Output 2700 ml  Net 804.08 ml    08/20 1901 - 08/22 0700 In: 6691.5 [I.V.:6691.5] Out: 3360 [Urine:3360]  Filed Weights   06/04/21 0132 06/06/21 0410 06/07/21 0441  Weight: 101.1 kg 110.8 kg 110.3 kg    CBC:  Recent Labs  Lab 06/03/21 1837 06/03/21 1851 06/05/21 0235 06/06/21 0300 06/07/21 0422  WBC 8.2  --  13.8* 14.3* 10.8*  HGB 17.7* 19.0* 18.3* 15.0 12.6*  HCT 51.9 56.0* 53.2* 44.0 37.7*  PLT 265  --  200 172 187  MCV 88.9  --  89.7 90.7 91.5  MCH 30.3  --  30.9 30.9 30.6  MCHC 34.1  --  34.4 34.1 33.4  RDW 13.1  --  13.5 13.9 14.1  LYMPHSABS 1.1  --   --   --   --   MONOABS 0.7  --   --   --   --   EOSABS 0.1  --   --   --   --   BASOSABS 0.0  --   --   --   --     Complete metabolic panel:  Recent Labs  Lab 06/03/21 1837 06/03/21 1851 06/03/21 2230 06/03/21 2232 06/04/21 0259 06/04/21 0456 06/04/21 0555 06/04/21 1252 06/04/21 1711 06/05/21 0235 06/06/21 0300 06/07/21 0422  NA 130*   < >  --  130* 132*  132*  --    < > 135 132* 133* 130* 134*   K 5.2*   < >  --  4.9 3.7  3.8  --    < > 3.7 4.1 4.6 3.7 3.8  CL 88*   < >  --  94* 96*  97*  --    < > 101 96* 97* 98 100  CO2 21*  --   --  16* 21*  22  --    < > 21* 23 21* 23 24  GLUCOSE 698*   < >  --  383* 208*  205*  --    < > 179* 240* 318* 217* 218*  BUN 18   < >  --  16 16  15   --    < > 17 16 14 11 7   CREATININE 1.36*   < >  --  1.27* 0.95  1.03  --    < > 0.98 1.09 1.06 0.88 0.76  CALCIUM 9.8  --   --  9.7 9.6  9.7  --    < > 9.9 9.3 9.1 7.8* 8.1*  AST 35  --   --   --  33  --   --   --   --  41 30 21  ALT 52*  --   --   --  48*  --   --   --   --  34 23 21  ALKPHOS 82  --   --   --  80  --   --   --   --  66 60 63  BILITOT 1.4*  --   --   --  1.2  --   --   --   --  1.5* 1.3* 1.4*  ALBUMIN 4.8  --   --   --  4.6  --   --   --   --  3.6 2.8* 2.6*  MG  --   --   --  2.5*  --   --   --   --   --   --   --   --   LATICACIDVEN  --   --  2.4*  --  1.4 1.3  --   --   --   --   --   --   TSH  --   --   --   --  1.021  --   --   --   --   --   --   --  HGBA1C  --   --   --   --  12.3*  --   --   --   --   --   --   --    < > = values in this interval not displayed.    Recent Labs  Lab 06/03/21 1837 06/04/21 0259 06/05/21 0235 06/06/21 0300 06/07/21 0422  LIPASE 891* 1,340* 1,380* 472* 100*    Recent Labs  Lab 06/03/21 2105  SARSCOV2NAA NEGATIVE    ------------------------------------------------------------------------------------------------------------------ No results for input(s): CHOL, HDL, LDLCALC, TRIG, CHOLHDL, LDLDIRECT in the last 72 hours.   Lab Results  Component Value Date   HGBA1C 12.3 (H) 06/04/2021   ------------------------------------------------------------------------------------------------------------------ No results for input(s): TSH, T4TOTAL, T3FREE, THYROIDAB in the last 72 hours.  Invalid input(s): FREET3  ------------------------------------------------------------------------------------------------------------------ No  results for input(s): VITAMINB12, FOLATE, FERRITIN, TIBC, IRON, RETICCTPCT in the last 72 hours.  Coagulation profile No results for input(s): INR, PROTIME in the last 168 hours. No results for input(s): DDIMER in the last 72 hours.  Cardiac Enzymes Recent Labs  Lab 06/03/21 2241  CKTOTAL 234    ------------------------------------------------------------------------------------------------------------------ No results found for: BNP   Antibiotics: Anti-infectives (From admission, onward)    None        Radiology Reports  No results found.    DVT prophylaxis: Lovenox  Code Status: Full code  Family Communication: No family at bedside   Consultants:   Procedures:     Objective    Physical Examination:   General-appears in no acute distress Heart-S1-S2, regular, no murmur auscultated Lungs-clear to auscultation bilaterally Abdomen-soft, mild tenderness to palpation in epigastric region Extremities-no edema in the lower extremities Neuro-alert, oriented x3, no focal deficit noted   Status is: Inpatient  Dispo: The patient is from: Home              Anticipated d/c is to: Home              Anticipated d/c date is: 06/08/2021              Patient currently not stable for discharge  Barrier to discharge-DKA, pancreatitis  COVID-19 Labs  No results for input(s): DDIMER, FERRITIN, LDH, CRP in the last 72 hours.  Lab Results  Component Value Date   SARSCOV2NAA NEGATIVE 06/03/2021    Microbiology  Recent Results (from the past 240 hour(s))  Resp Panel by RT-PCR (Flu A&B, Covid) Nasopharyngeal Swab     Status: None   Collection Time: 06/03/21  9:05 PM   Specimen: Nasopharyngeal Swab; Nasopharyngeal(NP) swabs in vial transport medium  Result Value Ref Range Status   SARS Coronavirus 2 by RT PCR NEGATIVE NEGATIVE Final    Comment: (NOTE) SARS-CoV-2 target nucleic acids are NOT DETECTED.  The SARS-CoV-2 RNA is generally detectable in upper  respiratory specimens during the acute phase of infection. The lowest concentration of SARS-CoV-2 viral copies this assay can detect is 138 copies/mL. A negative result does not preclude SARS-Cov-2 infection and should not be used as the sole basis for treatment or other patient management decisions. A negative result may occur with  improper specimen collection/handling, submission of specimen other than nasopharyngeal swab, presence of viral mutation(s) within the areas targeted by this assay, and inadequate number of viral copies(<138 copies/mL). A negative result must be combined with clinical observations, patient history, and epidemiological information. The expected result is Negative.  Fact Sheet for Patients:  BloggerCourse.com  Fact Sheet for Healthcare Providers:  SeriousBroker.it  This test is no  t yet approved or cleared by the [place] and  has been authorized for detection and/or diagnosis of SARS-CoV-2 by FDA under an Emergency Use Authorization (EUA). This EUA will remain  in effect (meaning this test can be used) for the duration of the COVID-19 declaration under Section 564(b)(1) of the Act, 21 U.S.C.section 360bbb-3(b)(1), unless the authorization is terminated  or revoked sooner.       Influenza A by PCR NEGATIVE NEGATIVE Final   Influenza B by PCR NEGATIVE NEGATIVE Final    Comment: (NOTE) The Xpert Xpress SARS-CoV-2/FLU/RSV plus assay is intended as an aid in the diagnosis of influenza from Nasopharyngeal swab specimens and should not be used as a sole basis for treatment. Nasal washings and aspirates are unacceptable for Xpert Xpress SARS-CoV-2/FLU/RSV testing.  Fact Sheet for Patients: BloggerCourse.com  Fact Sheet for Healthcare Providers: SeriousBroker.it  This test is not yet approved or cleared by the [place] FDA and has been  authorized for detection and/or diagnosis of SARS-CoV-2 by FDA under an Emergency Use Authorization (EUA). This EUA will remain in effect (meaning this test can be used) for the duration of the COVID-19 declaration under Section 564(b)(1) of the Act, 21 U.S.C. section 360bbb-3(b)(1), unless the authorization is terminated or revoked.  Performed at [facility], [address], [place], [place] [address]   MRSA Next Gen by PCR, Nasal     Status: None   Collection Time: 06/04/21  1:38 AM   Specimen: Nasal Mucosa; Nasal Swab  Result Value Ref Range Status   MRSA by PCR Next Gen NOT DETECTED NOT DETECTED Final    Comment: (NOTE) The GeneXpert MRSA Assay (FDA approved for NASAL specimens only), is one component of a comprehensive MRSA colonization surveillance program. It is not intended to diagnose MRSA infection nor to guide or monitor treatment for MRSA infections. Test performance is not FDA approved in patients less than 7 years old. Performed at [facility], [address] [place], [place] [address]              Meredeth Ide   [facility] If 7PM-7AM, please contact night-coverage at [email or web address], Office  [phone]   06/07/2021, 9:00 AM  LOS: 3 days

## 2021-06-07 NOTE — Plan of Care (Signed)
  A&Ox4  Clear Liquid Diet  AC&HS blood sugars with SSI/LAI  BM-8/20  Daily Weight  20G Left arm- LR@200   DVT Prevention: Lovenox  SCD(refused)  Telemetry: STach        06/06/2021  0250 6 beat Vtach  Up with assistance to BR or urinal  Room air  CIWA- ativan protocol completed and d/c'd  Abnormal Labs/ VS: Q4 due to yellow mews  Temp 100.8  Problem: Education: Goal: Ability to describe self-care measures that may prevent or decrease co mplications (Diabetes Survival Skills Education) will improve Outcome: Progressing Goal: Individualized Educational Video(s) Outcome: Progressing   Problem: Cardiac: Goal: Ability to maintain an adequate cardiac output will improve Outcome: Progressing   Problem: Health Behavior/Discharge Planning: Goal: Ability to identify and utilize available resources and services will improve Outcome: Progressing Goal: Ability to manage health-related needs will improve Outcome: Progressing   Problem: Fluid Volume: Goal: Ability to achieve a balanced intake and output will improve Outcome: Progressing   Problem: Metabolic: Goal: Ability to maintain appropriate glucose levels will improve Outcome: Progressing   Problem: Nutritional: Goal: Maintenance of adequate nutrition will improve Outcome: Progressing Goal: Maintenance of adequate weight for body size and type will improve Outcome: Progressing   Problem: Respiratory: Goal: Will regain and/or maintain adequate ventilation Outcome: Progressing

## 2021-06-07 NOTE — TOC Initial Note (Signed)
Transition of Care [facility]) - Initial/Assessment Note    Patient Details  Name: Edward Ware MRN: [id number] Date of Birth: [birth year]  Transition of [facility]) CM/SW Contact:    Lanier Clam, RN Phone Number: 06/07/2021, 9:42 AM  Clinical Narrative:   Referral for new DM,insulin,pcp-informed patient about MATCH program-he works,has $3 for each med-[facility] pharmacy,[facility]. Continue to monitor for d/c plans.                Expected Discharge Plan: Home/Self Care Barriers to Discharge: Continued Medical Work up   Patient Goals and CMS Choice Patient states their goals for this hospitalization and ongoing recovery are:: go home CMS Medicare.gov Compare Post Acute Care list provided to:: Patient Choice offered to / list presented to : Patient  Expected Discharge Plan and Services Expected Discharge Plan: Home/Self Care   Discharge Planning Services: CM Consult   Living arrangements for the past 2 months: Single Family Home                                      Prior Living Arrangements/Services Living arrangements for the past 2 months: Single Family Home Lives with:: Self Patient language and need for interpreter reviewed:: Yes Do you feel safe going back to the place where you live?: Yes      Need for Family Participation in Patient Care: No (Comment) Care giver support system in place?: Yes (comment)   Criminal Activity/Legal Involvement Pertinent to Current Situation/Hospitalization: No - Comment as needed  Activities of Daily Living Home Assistive Devices/Equipment: None ADL Screening (condition at time of admission) Patient's cognitive ability adequate to safely complete daily activities?: Yes Is the patient deaf or have difficulty hearing?: No Does the patient have difficulty seeing, even when wearing glasses/contacts?: No Does the patient have difficulty concentrating, remembering, or making decisions?: No Patient able to express need for assistance with  ADLs?: Yes Does the patient have difficulty dressing or bathing?: No Independently performs ADLs?: Yes (appropriate for developmental age) Does the patient have difficulty walking or climbing stairs?: No Weakness of Legs: Both Weakness of Arms/Hands: Both  Permission Sought/Granted Permission sought to share information with : Case Manager Permission granted to share information with : Yes, Verbal Permission Granted  Share Information with NAME: Case manager           Emotional Assessment Appearance:: Appears stated age Attitude/Demeanor/Rapport: Gracious Affect (typically observed): Accepting Orientation: : Oriented to Place, Oriented to  Time, Oriented to Situation Alcohol / Substance Use: Not Applicable Psych Involvement: No (comment)  Admission diagnosis:  DKA (diabetic ketoacidosis) (HCC) [E11.10] Diabetic ketoacidosis without coma associated with type 2 diabetes mellitus (HCC) [E11.10] Patient Active Problem List   Diagnosis Date Noted   DKA (diabetic ketoacidosis) (HCC) 06/03/2021   ETOH abuse 06/03/2021   Tobacco abuse 06/03/2021   Pancreatitis 06/03/2021   Dehydration 06/03/2021   PCP:  Patient, No Pcp Per (Inactive) Pharmacy:   [facility] [phone] - [place], [place] - [address] AT [facility] [address] [place] [address] Phone: [phone] Fax: [phone]  [id number] - [place], [place] - [address] AT [facility] [address]  [place] [address] Phone: [phone] Fax: [phone]     Social Determinants of Health (SDOH) Interventions    Readmission Risk Interventions No flowsheet data found.

## 2021-06-07 NOTE — Progress Notes (Addendum)
Spoke with patient about new onset of diabetes. States that his mother has diabetes and takes insulin. Reviewed insulin pen administration with him. Return demonstration was successful. Reviewed how to store insulin and injection sites. Encouraged patient to practice giving own injection while in the hospital. Reviewed HgbA1C of 12.3% and what normal blood sugar should be. Patient states that he is going to try to get health insurance after discharge. He does have the [employer] glucose meter kit with him that was delivered to him last week.   Will continue to follow his blood sugars while in the hospital.   Harvel Ricks RN BSN CDE Diabetes Coordinator Pager: [phone]  8am-5pm

## 2021-06-07 NOTE — Progress Notes (Signed)
   06/07/21 1245  Mobility  Activity Ambulated in hall  Level of Assistance Standby assist, set-up cues, supervision of patient - no hands on  Distance Ambulated (ft) 300 ft  Mobility Ambulated with assistance in hallway  Mobility Response Tolerated well  Mobility performed by Mobility specialist  $Mobility charge 1 Mobility   Pt ambulated in hallway about 322ft while pushing IV pole, tolerated well. He did note some pain, but did not specify where. Upon arrival back to his room, he did state he was a bit dizzy. Had pt sit EOB for a couple minutes until he was back a baseline. Left pt in room with call bell at side, and family present. Pt asked for pain medication while MS was exiting room. Notified nurse about request and session.   Timoteo Expose Mobility Specialist Acute Rehab Services Office: [phone]

## 2021-06-07 NOTE — Progress Notes (Signed)
   06/07/21 0300  ECG Monitoring  Cardiac Rhythm  (6 beat run of vtach call received from CCMD)  Pt asymptomatic and currently running Stach. On call provider Linton Flemings NP aware. No new orders received.

## 2021-06-07 NOTE — Plan of Care (Deleted)
  Problem: Education: Goal: Ability to describe self-care measures that may prevent or decrease compliA&Ox4  Clear Liquid Diet  AC&HS blood sugars with SSI/LAI  BM-8/20  Daily Weight  20G Left arm- LR@200   DVT Prevention: Lovenox  SCD(refused)  Telemetry: STach        06/06/2021  0250 6 beat Vtach  Up with assistance to BR or urinal  Room air  CIWA- ativan protocol completed and d/c'd  Abnormal Labs/ VS: Q4 due to yellow mews  Temp 100.8   Barriers to Discharge: awaiting additional education from DM educator   PRN's: Dilaudid @ 0639  Oxycodone @ 0342  Tylenol @ 2049  Nursing Comments:cations (Diabetes Survival Skills Education) will improve 06/07/2021 0527 by Moishe Spice, RN Outcome: Progressing 06/07/2021 0519 by Moishe Spice, RN Outcome: Progressing Goal: Individualized Educational Video(s) 06/07/2021 0527 by Moishe Spice, RN Outcome: Progressing 06/07/2021 0519 by Moishe Spice, RN Outcome: Progressing   Problem: Cardiac: Goal: Ability to maintain an adequate cardiac output will improve 06/07/2021 0527 by Moishe Spice, RN Outcome: Progressing 06/07/2021 0519 by Moishe Spice, RN Outcome: Progressing   Problem: Health Behavior/Discharge Planning: Goal: Ability to identify and utilize available resources and services will improve 06/07/2021 0527 by Moishe Spice, RN Outcome: Progressing 06/07/2021 0519 by Moishe Spice, RN Outcome: Progressing Goal: Ability to manage health-related needs will improve 06/07/2021 0527 by Moishe Spice, RN Outcome: Progressing 06/07/2021 0519 by Moishe Spice, RN Outcome: Progressing   Problem: Fluid Volume: Goal: Ability to achieve a balanced intake and output will improve 06/07/2021 0527 by Moishe Spice, RN Outcome: Progressing 06/07/2021 0519 by Moishe Spice, RN Outcome: Progressing   Problem: Metabolic: Goal: Ability to maintain appropriate glucose levels will improve 06/07/2021 0527 by  Moishe Spice, RN Outcome: Progressing 06/07/2021 0519 by Moishe Spice, RN Outcome: Progressing   Problem: Nutritional: Goal: Maintenance of adequate nutrition will improve 06/07/2021 0527 by Moishe Spice, RN Outcome: Progressing 06/07/2021 0519 by Moishe Spice, RN Outcome: Progressing Goal: Maintenance of adequate weight for body size and type will improve 06/07/2021 0527 by Moishe Spice, RN Outcome: Progressing 06/07/2021 0519 by Moishe Spice, RN Outcome: Progressing   Problem: Respiratory: Goal: Will regain and/or maintain adequate ventilation 06/07/2021 0527 by Moishe Spice, RN Outcome: Progressing 06/07/2021 0519 by Moishe Spice, RN Outcome: Progressing

## 2021-06-08 ENCOUNTER — Other Ambulatory Visit (HOSPITAL_COMMUNITY): Payer: Self-pay

## 2021-06-08 ENCOUNTER — Other Ambulatory Visit: Payer: Self-pay

## 2021-06-08 LAB — CBC
HCT: 38.1 % — ABNORMAL LOW (ref 39.0–52.0)
Hemoglobin: 12.9 g/dL — ABNORMAL LOW (ref 13.0–17.0)
MCH: 30.5 pg (ref 26.0–34.0)
MCHC: 33.9 g/dL (ref 30.0–36.0)
MCV: 90.1 fL (ref 80.0–100.0)
Platelets: 213 10*3/uL (ref 150–400)
RBC: 4.23 MIL/uL (ref 4.22–5.81)
RDW: 14.2 % (ref 11.5–15.5)
WBC: 10 10*3/uL (ref 4.0–10.5)
nRBC: 0 % (ref 0.0–0.2)

## 2021-06-08 LAB — COMPREHENSIVE METABOLIC PANEL
ALT: 26 U/L (ref 0–44)
AST: 33 U/L (ref 15–41)
Albumin: 2.3 g/dL — ABNORMAL LOW (ref 3.5–5.0)
Alkaline Phosphatase: 85 U/L (ref 38–126)
Anion gap: 10 (ref 5–15)
BUN: 8 mg/dL (ref 6–20)
CO2: 25 mmol/L (ref 22–32)
Calcium: 8.3 mg/dL — ABNORMAL LOW (ref 8.9–10.3)
Chloride: 100 mmol/L (ref 98–111)
Creatinine, Ser: 0.77 mg/dL (ref 0.61–1.24)
GFR, Estimated: >60 mL/min (ref >60)
Glucose, Bld: 199 mg/dL — ABNORMAL HIGH (ref 70–99)
Potassium: 3.7 mmol/L (ref 3.5–5.1)
Sodium: 135 mmol/L (ref 135–145)
Total Bilirubin: 1.2 mg/dL (ref 0.3–1.2)
Total Protein: 6 g/dL — ABNORMAL LOW (ref 6.5–8.1)

## 2021-06-08 LAB — GLUCOSE, CAPILLARY
Glucose-Capillary: 178 mg/dL — ABNORMAL HIGH (ref 70–99)
Glucose-Capillary: 196 mg/dL — ABNORMAL HIGH (ref 70–99)

## 2021-06-08 LAB — LIPASE, BLOOD: Lipase: 84 U/L — ABNORMAL HIGH (ref 11–51)

## 2021-06-08 MED ORDER — THIAMINE HCL 100 MG PO TABS
100.0000 mg | ORAL_TABLET | Freq: Every day | ORAL | 0 refills | Status: DC
  Filled 2021-06-08: qty 30, 30d supply, fill #0

## 2021-06-08 MED ORDER — INSULIN PEN NEEDLE 31G X 5 MM MISC
0 refills | Status: DC
  Filled 2021-06-08: qty 100, 50d supply, fill #0

## 2021-06-08 MED ORDER — INSULIN ASPART PROT & ASPART (70-30 MIX) 100 UNIT/ML PEN
PEN_INJECTOR | SUBCUTANEOUS | 0 refills | Status: DC
  Filled 2021-06-08: qty 3, 10d supply, fill #0

## 2021-06-08 MED ORDER — METFORMIN HCL 500 MG PO TABS
500.0000 mg | ORAL_TABLET | Freq: Every day | ORAL | 11 refills | Status: DC
  Filled 2021-06-08: qty 30, 30d supply, fill #0

## 2021-06-08 MED ORDER — METFORMIN HCL 500 MG PO TABS
500.0000 mg | ORAL_TABLET | Freq: Every day | ORAL | 0 refills | Status: DC
  Filled 2021-06-08: qty 30, 30d supply, fill #0

## 2021-06-08 MED ORDER — OXYCODONE HCL 5 MG PO TABS: 5.0000 mg | ORAL_TABLET | Freq: Four times a day (QID) | ORAL | 0 refills | Status: DC | PRN

## 2021-06-08 MED ORDER — FOLIC ACID 1 MG PO TABS
1.0000 mg | ORAL_TABLET | Freq: Every day | ORAL | 0 refills | Status: DC
  Filled 2021-06-08: qty 30, 30d supply, fill #0

## 2021-06-08 MED ORDER — ATORVASTATIN CALCIUM 20 MG PO TABS
20.0000 mg | ORAL_TABLET | Freq: Every day | ORAL | 11 refills | Status: DC
  Filled 2021-06-08: qty 30, 30d supply, fill #0

## 2021-06-08 MED ORDER — LISINOPRIL 10 MG PO TABS
10.0000 mg | ORAL_TABLET | Freq: Every day | ORAL | 0 refills | Status: DC
  Filled 2021-06-08: qty 30, 30d supply, fill #0

## 2021-06-08 MED ORDER — TRUE METRIX METER W/DEVICE KIT
PACK | 0 refills | Status: AC
  Filled 2021-06-08: qty 1, 365d supply, fill #0

## 2021-06-08 MED ORDER — OXYCODONE HCL 5 MG PO TABS
5.0000 mg | ORAL_TABLET | Freq: Four times a day (QID) | ORAL | 0 refills | Status: DC | PRN
  Filled 2021-06-08: qty 20, 5d supply, fill #0

## 2021-06-08 MED ORDER — INSULIN ASPART PROT & ASPART (70-30 MIX) 100 UNIT/ML PEN
15.0000 [IU] | PEN_INJECTOR | Freq: Two times a day (BID) | SUBCUTANEOUS | 11 refills | Status: DC
  Filled 2021-06-08: qty 9, 30d supply, fill #0

## 2021-06-08 MED ORDER — LISINOPRIL 10 MG PO TABS
10.0000 mg | ORAL_TABLET | Freq: Every day | ORAL | 11 refills | Status: DC
  Filled 2021-06-08: qty 30, 30d supply, fill #0

## 2021-06-08 MED ORDER — TRUEPLUS LANCETS 28G MISC
0 refills | Status: AC
  Filled 2021-06-08: qty 100, 25d supply, fill #0

## 2021-06-08 MED ORDER — GLUCOSE BLOOD VI STRP
ORAL_STRIP | 0 refills | Status: AC
  Filled 2021-06-08: qty 100, 25d supply, fill #0

## 2021-06-08 NOTE — Discharge Summary (Addendum)
Physician Discharge Summary  Edward Ware JQZ:[id number] DOB: Jan 09, 1986 DOA: 06/03/2021  PCP: Patient, No Pcp Per (Inactive)  Admit date: 06/03/2021 Discharge date: 06/08/2021  Time spent: 60 minutes  Recommendations for Outpatient Follow-up:  Follow-up [facility] as outpatient   Discharge Diagnoses:  Active Problems:   DKA (diabetic ketoacidosis) ([place])   ETOH abuse   Tobacco abuse   Pancreatitis   Dehydration   Discharge Condition: Stable  Diet recommendation: Heart healthy diet  Filed Weights   06/04/21 0132 06/06/21 0410 06/07/21 0441  Weight: 101.1 kg 110.8 kg 110.3 kg    History of present illness:  35 year old male with history of obesity, tobacco abuse, alcohol abuse presented with abdominal pain for 2 days.  Lab work showed pancreatitis, confirmed on CT scan abdomen.  Lipase was elevated at 891, went up to 1340.  Patient also found to be in DKA, started on IV insulin.  Also started on CIWA protocol for alcohol withdrawal.  Hospital Course:   Diabetic ketoacidosis/new onset diabetes mellitus type 2 -DKA has resolved -Presented with DKA, blood glucose greater than 700; anion gap 21 -Started on IV insulin per DKA protocol -DKA resolved; IV insulin was discontinued.  Patient was started on Lantus and sliding scale insulin -Lantus dose increased to 25 units subcu daily, as CBG was elevated -CBG has improved after starting  NovoLog 3 units 3 times daily meal coverage -We will discharge patient home on NovoLog 70/30, 15 units subcu twice daily, metformin 500 mg p.o. daily   Acute pancreatitis -Secondary to alcohol abuse -Lipid profile showed cholesterol 269, HDL 35, LDL 193, triglyceride 204 -CT abdomen/pelvis shows acute edematous pancreatitis; advanced hepatic steatosis and hepatomegaly -Lipase was initially at 891; went up to 1340.  Today lipase down to 84 -Patient was started on clear liquid diet after discussion with Dr. Benson Norway, GI.  -Diet  advanced to soft diet -We will discharge patient home    Alcohol abuse -Started on Ativan per CIWA protocol; no signs of alcohol withdrawal -Continue thiamine, folate -Social work to give information on outpatient alcohol rehab   Hypertension -We will start lisinopril 10 mg p.o. daily   Hyperlipidemia -Lipid profile showed cholesterol 269, HDL 35, LDL 193, triglyceride 204 -Start atorvastatin 20 mg p.o. daily  Procedures:   Consultations:   Discharge Exam: Vitals:   06/08/21 0621 06/08/21 1202  BP: (!) 150/94 (!) 149/88  Pulse: 99 (!) 102  Resp: 18 18  Temp: 98.2 F (36.8 C) 100.1 F (37.8 C)  SpO2: 98% 96%    General: Appears in no acute distress Cardiovascular: S1-S2, regular, no murmur auscultated Respiratory: Clear to auscultation bilaterally  Discharge Instructions   Discharge Instructions     Diet - low sodium heart healthy   Complete by: As directed    Increase activity slowly   Complete by: As directed       Allergies as of 06/08/2021   No Known Allergies      Medication List     STOP taking these medications    dicyclomine 10 MG capsule Commonly known as: BENTYL   diphenhydramine-acetaminophen 25-500 MG Tabs tablet Commonly known as: TYLENOL PM   methocarbamol 500 MG tablet Commonly known as: ROBAXIN   naproxen 500 MG tablet Commonly known as: NAPROSYN   ondansetron 4 MG tablet Commonly known as: ZOFRAN       TAKE these medications    atorvastatin 20 MG tablet Commonly known as: Lipitor Take 1 tablet (20 mg total) by mouth  daily.   folic acid 1 MG tablet Commonly known as: FOLVITE Take 1 tablet (1 mg total) by mouth daily. Start taking on: June 09, 2021   glucose blood test strip use up to 4 times daily as directed.   insulin aspart protamine - aspart (70-30) 100 UNIT/ML FlexPen Commonly known as: NOVOLOG 70/30 MIX Inject 15 Units into the skin 2 (two) times daily with a meal.   Insulin Pen Needle 31G X 5 MM  Misc use as directed   lisinopril 10 MG tablet Commonly known as: ZESTRIL Take 1 tablet (10 mg total) by mouth daily.   metFORMIN 500 MG tablet Commonly known as: Glucophage Take 1 tablet (500 mg total) by mouth daily with breakfast.   oxyCODONE 5 MG immediate release tablet Commonly known as: Oxy IR/ROXICODONE Take 1 tablet (5 mg total) by mouth every 6 (six) hours as needed for moderate pain.   thiamine 100 MG tablet Take 1 tablet (100 mg total) by mouth daily. Start taking on: June 09, 2021   True Metrix Meter w/Device Kit use up to 4 times daily as directed   TRUEplus Lancets 28G Misc use up to 4 times daily as directed.       No Known Allergies  Follow-up Information      Patient [place]. Schedule an appointment as soon as possible for a visit.   Specialty: Internal Medicine Contact information: [address] [place] [facility] Follow up.   Why: Pharmacy-go there directly after d/c for medicines.Cost$3/each for [facility] program Contact information: [address] [place] [address] [phone]                 The results of significant diagnostics from this hospitalization (including imaging, microbiology, ancillary and laboratory) are listed below for reference.    Significant Diagnostic Studies: CT ABDOMEN PELVIS WO CONTRAST  Result Date: 06/03/2021 CLINICAL DATA:  Diffuse abdominal pain with abdominal distension. Nausea. EXAM: CT ABDOMEN AND PELVIS WITHOUT CONTRAST TECHNIQUE: Multidetector CT imaging of the abdomen and pelvis was performed following the standard protocol without IV contrast. COMPARISON:  None. FINDINGS: Lower chest: The lung bases are clear.  The heart is normal in size. Hepatobiliary: Advanced hepatic steatosis with diffusely decreased hepatic density with areas of focal fatty sparing. The liver is enlarged  spanning 20.2 cm cranial caudal. There is no obvious focal hepatic lesion on this unenhanced exam. Possible sludge in the gallbladder without calcified stone or pericholecystic inflammation. No biliary dilatation. Pancreas: Peripancreatic fat stranding with enlargement of the pancreatic head and uncinate process. Ill-defined stranding and fluid tracks into the right retroperitoneum in the anterior pararenal space. No pancreatic ductal dilatation. No acute peripancreatic collection. No parenchymal air. Spleen: Normal in size without focal abnormality. Adrenals/Urinary Tract: Normal adrenal glands. No hydronephrosis or renal calculi. No perinephric edema. No evidence of focal renal abnormality on this unenhanced exam. Partially distended urinary bladder Stomach/Bowel: Ingested material within the stomach. Mild wall thickening of the duodenum is felt to be reactive. Remainder of the small bowel is decompressed. Normal appendix. Small to moderate colonic stool burden. Vascular/Lymphatic: Normal caliber abdominal aorta. No aortic atherosclerosis. No portal venous or mesenteric gas. No abdominopelvic adenopathy. Reproductive: Prostate is unremarkable. Other: Peripancreatic fat stranding and inflammation with ill-defined free fluid tracking in the right retroperitoneum and anterior pararenal space. No organized collection or free air no abdominal wall hernia. Musculoskeletal:  There are no acute or suspicious osseous abnormalities. There is a tiny vertebral body hemangioma within L4. Peripherally sclerotic lesion in the left inferior pubic ramus has a benign appearance. IMPRESSION: 1. Acute edematous pancreatitis. No acute peripancreatic collection or evidence of pancreatic necrosis on this unenhanced exam. 2. Advanced hepatic steatosis and hepatomegaly. Electronically Signed   By: Keith Rake M.D.   On: 06/03/2021 21:36   DG CHEST PORT 1 VIEW  Result Date: 06/03/2021 CLINICAL DATA:  DKA, upper abdominal pain EXAM:  PORTABLE CHEST 1 VIEW COMPARISON:  None. FINDINGS: Cardiomediastinal silhouette is within normal limits. There is no focal airspace disease. There is no large pleural effusion or visible pneumothorax. There is no acute osseous abnormality. IMPRESSION: No evidence of acute cardiopulmonary disease. Electronically Signed   By: Maurine Simmering M.D.   On: 06/03/2021 21:21   DG Abd Portable 1V  Result Date: 06/05/2021 CLINICAL DATA:  Abdominal pain with pancreatitis EXAM: PORTABLE ABDOMEN - 1 VIEW COMPARISON:  06/03/2021 CT FINDINGS: Single supine view of the abdomen and pelvis demonstrates a nonobstructive bowel-gas pattern. No abnormal abdominal calcifications. No appendicolith. No gross free intraperitoneal air. IMPRESSION: No acute findings. Electronically Signed   By: Abigail Miyamoto M.D.   On: 06/05/2021 08:53    Microbiology: Recent Results (from the past 240 hour(s))  Resp Panel by RT-PCR (Flu A&B, Covid) Nasopharyngeal Swab     Status: None   Collection Time: 06/03/21  9:05 PM   Specimen: Nasopharyngeal Swab; Nasopharyngeal(NP) swabs in vial transport medium  Result Value Ref Range Status   SARS Coronavirus 2 by RT PCR NEGATIVE NEGATIVE Final    Comment: (NOTE) SARS-CoV-2 target nucleic acids are NOT DETECTED.  The SARS-CoV-2 RNA is generally detectable in upper respiratory specimens during the acute phase of infection. The lowest concentration of SARS-CoV-2 viral copies this assay can detect is 138 copies/mL. A negative result does not preclude SARS-Cov-2 infection and should not be used as the sole basis for treatment or other patient management decisions. A negative result may occur with  improper specimen collection/handling, submission of specimen other than nasopharyngeal swab, presence of viral mutation(s) within the areas targeted by this assay, and inadequate number of viral copies(<138 copies/mL). A negative result must be combined with clinical observations, patient history, and  epidemiological information. The expected result is Negative.  Fact Sheet for Patients:  EntrepreneurPulse.com.au  Fact Sheet for Healthcare Providers:  IncredibleEmployment.be  This test is no t yet approved or cleared by the [place] FDA and  has been authorized for detection and/or diagnosis of SARS-CoV-2 by FDA under an Emergency Use Authorization (EUA). This EUA will remain  in effect (meaning this test can be used) for the duration of the COVID-19 declaration under Section 564(b)(1) of the Act, 21 U.S.C.section 360bbb-3(b)(1), unless the authorization is terminated  or revoked sooner.       Influenza A by PCR NEGATIVE NEGATIVE Final   Influenza B by PCR NEGATIVE NEGATIVE Final    Comment: (NOTE) The Xpert Xpress SARS-CoV-2/FLU/RSV plus assay is intended as an aid in the diagnosis of influenza from Nasopharyngeal swab specimens and should not be used as a sole basis for treatment. Nasal washings and aspirates are unacceptable for Xpert Xpress SARS-CoV-2/FLU/RSV testing.  Fact Sheet for Patients: EntrepreneurPulse.com.au  Fact Sheet for Healthcare Providers: IncredibleEmployment.be  This test is not yet approved or cleared by the [place] FDA and has been authorized for detection and/or diagnosis of SARS-CoV-2 by FDA under an Emergency Use Authorization (EUA).  This EUA will remain in effect (meaning this test can be used) for the duration of the COVID-19 declaration under Section 564(b)(1) of the Act, 21 U.S.C. section 360bbb-3(b)(1), unless the authorization is terminated or revoked.  Performed at [facility], [place] [address], [place], [place] [address]   MRSA Next Gen by PCR, Nasal     Status: None   Collection Time: 06/04/21  1:38 AM   Specimen: Nasal Mucosa; Nasal Swab  Result Value Ref Range Status   MRSA by PCR Next Gen NOT DETECTED NOT DETECTED Final     Comment: (NOTE) The GeneXpert MRSA Assay (FDA approved for NASAL specimens only), is one component of a comprehensive MRSA colonization surveillance program. It is not intended to diagnose MRSA infection nor to guide or monitor treatment for MRSA infections. Test performance is not FDA approved in patients less than 68 years old. Performed at [facility], [place] [address], [place],  [address]      Labs: Basic Metabolic Panel: Recent Labs  Lab 06/03/21 2232 06/04/21 0259 06/04/21 1711 06/05/21 0235 06/06/21 0300 06/07/21 0422 06/08/21 0435  NA 130*   < > 132* 133* 130* 134* 135  K 4.9   < > 4.1 4.6 3.7 3.8 3.7  CL 94*   < > 96* 97* 98 100 100  CO2 16*   < > 23 21* _0 GLUCOSE 383*   < > 240* 318* 217* 218* 199*  BUN 16   < > _1 CREATININE 1.27*   < > 1.09 1.06 0.88 0.76 0.77  CALCIUM 9.7   < > 9.3 9.1 7.8* 8.1* 8.3*  MG 2.5*  --   --   --   --   --   --   PHOS 3.9  --   --   --   --   --   --    < > = values in this interval not displayed.   Liver Function Tests: Recent Labs  Lab 06/04/21 0259 06/05/21 0235 06/06/21 0300 06/07/21 0422 06/08/21 0435  AST 33 41 30 21 33  ALT 48* 34 _2 ALKPHOS 80 66 60 63 85  BILITOT 1.2 1.5* 1.3* 1.4* 1.2  PROT 8.6* 7.3 6.0* 6.0* 6.0*  ALBUMIN 4.6 3.6 2.8* 2.6* 2.3*   Recent Labs  Lab 06/04/21 0259 06/05/21 0235 06/06/21 0300 06/07/21 0422 06/08/21 0435  LIPASE 1,340* 1,380* 472* 100* 84*   No results for input(s): AMMONIA in the last 168 hours. CBC: Recent Labs  Lab 06/03/21 1837 06/03/21 1851 06/05/21 0235 06/06/21 0300 06/07/21 0422 06/08/21 0435  WBC 8.2  --  13.8* 14.3* 10.8* 10.0  NEUTROABS 6.3  --   --   --   --   --   HGB 17.7* 19.0* 18.3* 15.0 12.6* 12.9*  HCT 51.9 56.0* 53.2* 44.0 37.7* 38.1*  MCV 88.9  --  89.7 90.7 91.5 90.1  PLT 265  --  200 172 187 213   Cardiac Enzymes: Recent Labs  Lab 06/03/21 2241  CKTOTAL 234   BNP:  CBG: Recent Labs   Lab 06/07/21 1117 06/07/21 1635 06/07/21 2118 06/08/21 0734 06/08/21 1115  GLUCAP 227* 213* 237* 178* 196*       Signed:  Oswald Hillock MD.  [facility] 06/08/2021, 2:06 PM

## 2021-06-08 NOTE — Progress Notes (Signed)
Called by RN that patient was unable to pick up prescriptions from [facility] as it closed at noon time. Called [facility] outpatient pharmacy and asked them to fill NovoLog 70/30, metformin, lisinopril.  Called and told patient to get these prescriptions from [facility] outpatient pharmacy before 6:00 PM. He can pick up rest of prescriptions from [facility] pharmacy tomorrow.

## 2021-06-08 NOTE — TOC Transition Note (Addendum)
Transition of Care [facility]) - CM/SW Discharge Note   Patient Details  Name: CHISUM HABENICHT MRN: [id number] Date of Birth: 16-Nov-1985  Transition of Care [facility]) CM/SW Contact:  Lanier Clam, RN Phone Number: 06/08/2021, 12:28 PM   Clinical Narrative: MATCH program provided-informed of $3 co pay for each med/no narcotics-mut go directly to [facility] pharmacy @ d/c-voiced understanding,provided w/otpt etoh,sa resources, pcp info given to make own appt-voiced understanding. Manual script for narcotic given.No further CM needs.  -4p-Received call from patient that he cant get insulin until tomorrow-CM called [facility]-no answer,called [facility] otpt pharmacy-they checked & saw the med is at [facility] pharmacy & waiting for pick up,also confirmed med is just waiting for p/u @ [facility] with [facility] otpt  pharamcy-med is at [facility] pharmacy-[address], [place]-informed patient they are open until 5p-patient states he is already at home can someone else pick it up-I said yes but make sure you have the [facility] program letter-he voiced understanding. Noted patient already has the relion meter, & has strips-he can use. No further CM needs.    Final next level of care: Home/Self Care Barriers to Discharge: No Barriers Identified   Patient Goals and CMS Choice Patient states their goals for this hospitalization and ongoing recovery are:: go home CMS Medicare.gov Compare Post Acute Care list provided to:: Patient Choice offered to / list presented to : Patient  Discharge Placement                       Discharge Plan and Services   Discharge Planning Services: CM Consult                                 Social Determinants of Health (SDOH) Interventions     Readmission Risk Interventions No flowsheet data found.

## 2021-06-08 NOTE — Progress Notes (Signed)
The patient was discharged per MD order. The patient and Mother (at bedside) received all discharge instructions including all diabetes teaching. The instructions were explained verbally and also highlighted for later review if needed.  The patient nor mother endorse no additional questions at this time regarding the information that was received. All hospital equipment was removed prior to discharge. The patient was wheeled down to the front by Staff and released into the care of his mother with no complications.

## 2021-06-08 NOTE — Plan of Care (Signed)
  Problem: Metabolic: Goal: Ability to maintain appropriate glucose levels will improve Outcome: Progressing   Problem: Health Behavior/Discharge Planning: Goal: Ability to identify and utilize available resources and services will improve Outcome: Progressing Goal: Ability to manage health-related needs will improve Outcome: Progressing

## 2021-06-09 ENCOUNTER — Other Ambulatory Visit: Payer: Self-pay

## 2021-06-09 ENCOUNTER — Other Ambulatory Visit (HOSPITAL_COMMUNITY): Payer: Self-pay

## 2021-06-09 ENCOUNTER — Ambulatory Visit: Payer: Self-pay | Admitting: *Deleted

## 2021-06-09 NOTE — Telephone Encounter (Signed)
Patient's sister called to ask question regarding insulin. Sister not on DPR. Sister contacted patient and patient on 3 way call with NT. C/o elevated blood glucose and pain in abdomen. Patient newly diagnosed diabetic. Sister reports patient's blood glucose this am 254. C/o frequent urination, dizziness and chills. Denies blurred vision , weakness on either side, no chest pain, difficulty breathing. No thermometer available to check temp. C/o blood glucose 15 minutes prior to call at 320 before patient ate. Sister told patient to take pm dose of novolog 7/30 15 units . Recommended patient drink water and rechecked blood glucose for 281 at 7:28 pm. Reviewed with patient and sister it will take time for medications and insulin to manage diabetes. Continue to monitor blood glucose and keep a journal of blood glucose and times and foods eaten. Patient and sister verbalized understanding and felt better after seeing blood glucose decrease. Care advise given. Instructed patient to set up for PCP. Patient and sister verbalized understanding of care advise and to call back or go to ED if symptoms worsen.

## 2021-06-09 NOTE — Telephone Encounter (Signed)
Reason for Disposition . [1] Blood glucose 240 - 300 mg/dL (20.1 - 00.7 mmol/L) AND [2] uses insulin (e.g., insulin-dependent, all people with type 1 diabetes)  Answer Assessment - Initial Assessment Questions 1. BLOOD GLUCOSE: "What is your blood glucose level?"      320 15  minutes ago  2. ONSET: "When did you check the blood glucose?"     15 minutes ago  3. USUAL RANGE: "What is your glucose level usually?" (e.g., usual fasting morning value, usual evening value)     New diabetic and does not know  4. KETONES: "Do you check for ketones (urine or blood test strips)?" If yes, ask: "What does the test show now?"      Na  5. TYPE 1 or 2:  "Do you know what type of diabetes you have?"  (e.g., Type 1, Type 2, Gestational; doesn't know)      Type 2  6. INSULIN: "Do you take insulin?" "What type of insulin(s) do you use? What is the mode of delivery? (syringe, pen; injection or pump)?"      Yes just started novolog 70/30 15 units with meals 2 x day  7. DIABETES PILLS: "Do you take any pills for your diabetes?" If yes, ask: "Have you missed taking any pills recently?"     Metformin 500 mg  8. OTHER SYMPTOMS: "Do you have any symptoms?" (e.g., fever, frequent urination, difficulty breathing, dizziness, weakness, vomiting)     Frequent urination , dizziness,.chills 9. PREGNANCY: "Is there any chance you are pregnant?" "When was your last menstrual period?"     na  Protocols used: Diabetes - High Blood Sugar-A-AH

## 2021-06-10 ENCOUNTER — Other Ambulatory Visit (HOSPITAL_COMMUNITY): Payer: Self-pay

## 2021-06-11 ENCOUNTER — Inpatient Hospital Stay (HOSPITAL_COMMUNITY)
Admission: EM | Admit: 2021-06-11 | Discharge: 2021-06-16 | DRG: 439 | Disposition: A | Discharge status: Home / Self Care | Payer: Self-pay | Attending: Family Medicine | Admitting: Family Medicine

## 2021-06-11 ENCOUNTER — Other Ambulatory Visit (HOSPITAL_COMMUNITY): Payer: Self-pay

## 2021-06-11 ENCOUNTER — Encounter (HOSPITAL_COMMUNITY): Payer: Self-pay

## 2021-06-11 ENCOUNTER — Other Ambulatory Visit: Payer: Self-pay

## 2021-06-11 DIAGNOSIS — I96 Gangrene, not elsewhere classified: Secondary | ICD-10-CM | POA: Diagnosis present

## 2021-06-11 DIAGNOSIS — E1152 Type 2 diabetes mellitus with diabetic peripheral angiopathy with gangrene: Secondary | ICD-10-CM | POA: Diagnosis present

## 2021-06-11 DIAGNOSIS — E1165 Type 2 diabetes mellitus with hyperglycemia: Secondary | ICD-10-CM | POA: Diagnosis present

## 2021-06-11 DIAGNOSIS — Z794 Long term (current) use of insulin: Secondary | ICD-10-CM

## 2021-06-11 DIAGNOSIS — Z8249 Family history of ischemic heart disease and other diseases of the circulatory system: Secondary | ICD-10-CM

## 2021-06-11 DIAGNOSIS — Z7984 Long term (current) use of oral hypoglycemic drugs: Secondary | ICD-10-CM

## 2021-06-11 DIAGNOSIS — K859 Acute pancreatitis without necrosis or infection, unspecified: Secondary | ICD-10-CM | POA: Diagnosis present

## 2021-06-11 DIAGNOSIS — D75838 Other thrombocytosis: Secondary | ICD-10-CM | POA: Diagnosis present

## 2021-06-11 DIAGNOSIS — K852 Alcohol induced acute pancreatitis without necrosis or infection: Principal | ICD-10-CM | POA: Diagnosis present

## 2021-06-11 DIAGNOSIS — I10 Essential (primary) hypertension: Secondary | ICD-10-CM

## 2021-06-11 DIAGNOSIS — E66811 Obesity, class 1: Secondary | ICD-10-CM

## 2021-06-11 DIAGNOSIS — Z833 Family history of diabetes mellitus: Secondary | ICD-10-CM

## 2021-06-11 DIAGNOSIS — Z87891 Personal history of nicotine dependence: Secondary | ICD-10-CM

## 2021-06-11 DIAGNOSIS — E669 Obesity, unspecified: Secondary | ICD-10-CM | POA: Diagnosis present

## 2021-06-11 DIAGNOSIS — Z720 Tobacco use: Secondary | ICD-10-CM | POA: Diagnosis present

## 2021-06-11 DIAGNOSIS — K863 Pseudocyst of pancreas: Secondary | ICD-10-CM | POA: Diagnosis present

## 2021-06-11 DIAGNOSIS — Z79899 Other long term (current) drug therapy: Secondary | ICD-10-CM

## 2021-06-11 DIAGNOSIS — Z20822 Contact with and (suspected) exposure to covid-19: Secondary | ICD-10-CM | POA: Diagnosis present

## 2021-06-11 DIAGNOSIS — F1010 Alcohol abuse, uncomplicated: Secondary | ICD-10-CM | POA: Diagnosis present

## 2021-06-11 DIAGNOSIS — D735 Infarction of spleen: Secondary | ICD-10-CM | POA: Diagnosis present

## 2021-06-11 DIAGNOSIS — K760 Fatty (change of) liver, not elsewhere classified: Secondary | ICD-10-CM

## 2021-06-11 DIAGNOSIS — I82890 Acute embolism and thrombosis of other specified veins: Secondary | ICD-10-CM

## 2021-06-11 DIAGNOSIS — E119 Type 2 diabetes mellitus without complications: Secondary | ICD-10-CM

## 2021-06-11 DIAGNOSIS — E860 Dehydration: Secondary | ICD-10-CM | POA: Diagnosis present

## 2021-06-11 HISTORY — DX: Type 2 diabetes mellitus without complications: E11.9

## 2021-06-11 HISTORY — DX: Essential (primary) hypertension: I10

## 2021-06-11 NOTE — ED Triage Notes (Signed)
Pt reports generalized abdominal pain and nausea.

## 2021-06-12 ENCOUNTER — Inpatient Hospital Stay (HOSPITAL_COMMUNITY): Payer: Self-pay

## 2021-06-12 ENCOUNTER — Encounter (HOSPITAL_COMMUNITY): Payer: Self-pay | Admitting: Family Medicine

## 2021-06-12 ENCOUNTER — Emergency Department (HOSPITAL_COMMUNITY): Payer: Self-pay

## 2021-06-12 DIAGNOSIS — I10 Essential (primary) hypertension: Secondary | ICD-10-CM

## 2021-06-12 DIAGNOSIS — Z720 Tobacco use: Secondary | ICD-10-CM

## 2021-06-12 DIAGNOSIS — F1010 Alcohol abuse, uncomplicated: Secondary | ICD-10-CM

## 2021-06-12 DIAGNOSIS — E669 Obesity, unspecified: Secondary | ICD-10-CM

## 2021-06-12 DIAGNOSIS — K859 Acute pancreatitis without necrosis or infection, unspecified: Secondary | ICD-10-CM | POA: Diagnosis present

## 2021-06-12 DIAGNOSIS — Z794 Long term (current) use of insulin: Secondary | ICD-10-CM

## 2021-06-12 DIAGNOSIS — E119 Type 2 diabetes mellitus without complications: Secondary | ICD-10-CM

## 2021-06-12 DIAGNOSIS — K858 Other acute pancreatitis without necrosis or infection: Secondary | ICD-10-CM

## 2021-06-12 DIAGNOSIS — E860 Dehydration: Secondary | ICD-10-CM

## 2021-06-12 DIAGNOSIS — E1169 Type 2 diabetes mellitus with other specified complication: Secondary | ICD-10-CM

## 2021-06-12 DIAGNOSIS — K760 Fatty (change of) liver, not elsewhere classified: Secondary | ICD-10-CM

## 2021-06-12 LAB — CBC WITH DIFFERENTIAL/PLATELET
Abs Immature Granulocytes: 0.59 10*3/uL — ABNORMAL HIGH (ref 0.00–0.07)
Basophils Absolute: 0.1 10*3/uL (ref 0.0–0.1)
Basophils Relative: 1 %
Eosinophils Absolute: 0.2 10*3/uL (ref 0.0–0.5)
Eosinophils Relative: 1 %
HCT: 41.5 % (ref 39.0–52.0)
Hemoglobin: 13.8 g/dL (ref 13.0–17.0)
Immature Granulocytes: 5 %
Lymphocytes Relative: 18 %
Lymphs Abs: 2.3 10*3/uL (ref 0.7–4.0)
MCH: 30.1 pg (ref 26.0–34.0)
MCHC: 33.3 g/dL (ref 30.0–36.0)
MCV: 90.6 fL (ref 80.0–100.0)
Monocytes Absolute: 1.3 10*3/uL — ABNORMAL HIGH (ref 0.1–1.0)
Monocytes Relative: 11 %
Neutro Abs: 7.8 10*3/uL — ABNORMAL HIGH (ref 1.7–7.7)
Neutrophils Relative %: 64 %
Platelets: 538 10*3/uL — ABNORMAL HIGH (ref 150–400)
RBC: 4.58 MIL/uL (ref 4.22–5.81)
RDW: 14.9 % (ref 11.5–15.5)
WBC: 12.2 10*3/uL — ABNORMAL HIGH (ref 4.0–10.5)
nRBC: 0.2 % (ref 0.0–0.2)

## 2021-06-12 LAB — RESP PANEL BY RT-PCR (FLU A&B, COVID) ARPGX2
Influenza A by PCR: NEGATIVE
Influenza B by PCR: NEGATIVE
SARS Coronavirus 2 by RT PCR: NEGATIVE

## 2021-06-12 LAB — RAPID URINE DRUG SCREEN, HOSP PERFORMED
Amphetamines: NOT DETECTED
Barbiturates: NOT DETECTED
Benzodiazepines: POSITIVE — AB
Cocaine: NOT DETECTED
Opiates: POSITIVE — AB
Tetrahydrocannabinol: NOT DETECTED

## 2021-06-12 LAB — URINALYSIS, ROUTINE W REFLEX MICROSCOPIC
Bacteria, UA: NONE SEEN
Bilirubin Urine: NEGATIVE
Glucose, UA: 50 mg/dL — AB
Hgb urine dipstick: NEGATIVE
Ketones, ur: 20 mg/dL — AB
Leukocytes,Ua: NEGATIVE
Nitrite: NEGATIVE
Protein, ur: 100 mg/dL — AB
Specific Gravity, Urine: 1.03 (ref 1.005–1.030)
pH: 5 (ref 5.0–8.0)

## 2021-06-12 LAB — LIPASE, BLOOD
Lipase: 109 U/L — ABNORMAL HIGH (ref 11–51)
Lipase: 129 U/L — ABNORMAL HIGH (ref 11–51)

## 2021-06-12 LAB — PROTIME-INR
INR: 1.1 (ref 0.8–1.2)
Prothrombin Time: 14.2 seconds (ref 11.4–15.2)

## 2021-06-12 LAB — CBC
HCT: 38.8 % — ABNORMAL LOW (ref 39.0–52.0)
Hemoglobin: 12.7 g/dL — ABNORMAL LOW (ref 13.0–17.0)
MCH: 30 pg (ref 26.0–34.0)
MCHC: 32.7 g/dL (ref 30.0–36.0)
MCV: 91.7 fL (ref 80.0–100.0)
Platelets: 533 10*3/uL — ABNORMAL HIGH (ref 150–400)
RBC: 4.23 MIL/uL (ref 4.22–5.81)
RDW: 15.1 % (ref 11.5–15.5)
WBC: 10.3 10*3/uL (ref 4.0–10.5)
nRBC: 0.2 % (ref 0.0–0.2)

## 2021-06-12 LAB — COMPREHENSIVE METABOLIC PANEL
ALT: 37 U/L (ref 0–44)
ALT: 42 U/L (ref 0–44)
AST: 28 U/L (ref 15–41)
AST: 35 U/L (ref 15–41)
Albumin: 2.6 g/dL — ABNORMAL LOW (ref 3.5–5.0)
Albumin: 3.1 g/dL — ABNORMAL LOW (ref 3.5–5.0)
Alkaline Phosphatase: 106 U/L (ref 38–126)
Alkaline Phosphatase: 122 U/L (ref 38–126)
Anion gap: 10 (ref 5–15)
Anion gap: 13 (ref 5–15)
BUN: 9 mg/dL (ref 6–20)
BUN: 9 mg/dL (ref 6–20)
CO2: 24 mmol/L (ref 22–32)
CO2: 26 mmol/L (ref 22–32)
Calcium: 8.6 mg/dL — ABNORMAL LOW (ref 8.9–10.3)
Calcium: 9.3 mg/dL (ref 8.9–10.3)
Chloride: 97 mmol/L — ABNORMAL LOW (ref 98–111)
Chloride: 98 mmol/L (ref 98–111)
Creatinine, Ser: 1.05 mg/dL (ref 0.61–1.24)
Creatinine, Ser: 1.07 mg/dL (ref 0.61–1.24)
GFR, Estimated: >60 mL/min (ref >60)
GFR, Estimated: >60 mL/min (ref >60)
Glucose, Bld: 253 mg/dL — ABNORMAL HIGH (ref 70–99)
Glucose, Bld: 270 mg/dL — ABNORMAL HIGH (ref 70–99)
Potassium: 3.7 mmol/L (ref 3.5–5.1)
Potassium: 3.7 mmol/L (ref 3.5–5.1)
Sodium: 133 mmol/L — ABNORMAL LOW (ref 135–145)
Sodium: 135 mmol/L (ref 135–145)
Total Bilirubin: 0.8 mg/dL (ref 0.3–1.2)
Total Bilirubin: 0.9 mg/dL (ref 0.3–1.2)
Total Protein: 7.9 g/dL (ref 6.5–8.1)
Total Protein: 8.6 g/dL — ABNORMAL HIGH (ref 6.5–8.1)

## 2021-06-12 LAB — LIPID PANEL
Cholesterol: 142 mg/dL (ref 0–200)
HDL: 21 mg/dL — ABNORMAL LOW (ref >40)
LDL Cholesterol: 91 mg/dL (ref 0–99)
Total CHOL/HDL Ratio: 6.8 RATIO
Triglycerides: 151 mg/dL — ABNORMAL HIGH (ref <150)
VLDL: 30 mg/dL (ref 0–40)

## 2021-06-12 LAB — GLUCOSE, CAPILLARY
Glucose-Capillary: 259 mg/dL — ABNORMAL HIGH (ref 70–99)
Glucose-Capillary: 305 mg/dL — ABNORMAL HIGH (ref 70–99)
Glucose-Capillary: 308 mg/dL — ABNORMAL HIGH (ref 70–99)

## 2021-06-12 LAB — ETHANOL: Alcohol, Ethyl (B): <10 mg/dL (ref <10)

## 2021-06-12 LAB — CBG MONITORING, ED: Glucose-Capillary: 281 mg/dL — ABNORMAL HIGH (ref 70–99)

## 2021-06-12 LAB — APTT: aPTT: 31 seconds (ref 24–36)

## 2021-06-12 MED ORDER — HYDROMORPHONE HCL 1 MG/ML IJ SOLN
1.0000 mg | Freq: Once | INTRAMUSCULAR | Status: AC
  Administered 2021-06-12: 1 mg via INTRAVENOUS
  Filled 2021-06-12: qty 1

## 2021-06-12 MED ORDER — SODIUM CHLORIDE 0.9 % IV BOLUS
1000.0000 mL | Freq: Once | INTRAVENOUS | Status: AC
  Administered 2021-06-12: 1000 mL via INTRAVENOUS

## 2021-06-12 MED ORDER — SODIUM CHLORIDE 0.9 % IV SOLN: INTRAVENOUS | Status: DC

## 2021-06-12 MED ORDER — ADULT MULTIVITAMIN W/MINERALS CH
1.0000 | ORAL_TABLET | Freq: Every day | ORAL | Status: DC
  Administered 2021-06-12 – 2021-06-16 (×5): 1 via ORAL
  Filled 2021-06-12 (×5): qty 1

## 2021-06-12 MED ORDER — ACETAMINOPHEN 325 MG PO TABS: 650.0000 mg | ORAL_TABLET | Freq: Four times a day (QID) | ORAL | Status: DC | PRN

## 2021-06-12 MED ORDER — ONDANSETRON HCL 4 MG/2ML IJ SOLN: 4.0000 mg | Freq: Four times a day (QID) | INTRAMUSCULAR | Status: DC | PRN

## 2021-06-12 MED ORDER — HEPARIN BOLUS VIA INFUSION
2500.0000 [IU] | Freq: Once | INTRAVENOUS | Status: DC
  Filled 2021-06-12: qty 2500

## 2021-06-12 MED ORDER — THIAMINE HCL 100 MG PO TABS
100.0000 mg | ORAL_TABLET | Freq: Every day | ORAL | Status: DC
  Administered 2021-06-12 – 2021-06-16 (×5): 100 mg via ORAL
  Filled 2021-06-12 (×5): qty 1

## 2021-06-12 MED ORDER — HEPARIN (PORCINE) 25000 UT/250ML-% IV SOLN
1700.0000 [IU]/h | INTRAVENOUS | Status: DC
  Filled 2021-06-12: qty 250

## 2021-06-12 MED ORDER — LISINOPRIL 10 MG PO TABS
10.0000 mg | ORAL_TABLET | Freq: Every day | ORAL | Status: DC
  Administered 2021-06-12 – 2021-06-16 (×5): 10 mg via ORAL
  Filled 2021-06-12 (×5): qty 1

## 2021-06-12 MED ORDER — THIAMINE HCL 100 MG/ML IJ SOLN
100.0000 mg | Freq: Every day | INTRAMUSCULAR | Status: DC
  Filled 2021-06-12: qty 2

## 2021-06-12 MED ORDER — INSULIN ASPART 100 UNIT/ML IJ SOLN: 6.0000 [IU] | Freq: Once | INTRAMUSCULAR | Status: DC

## 2021-06-12 MED ORDER — ONDANSETRON HCL 4 MG/2ML IJ SOLN
4.0000 mg | Freq: Once | INTRAMUSCULAR | Status: AC
  Administered 2021-06-12: 4 mg via INTRAVENOUS
  Filled 2021-06-12: qty 2

## 2021-06-12 MED ORDER — HYDROMORPHONE HCL 1 MG/ML IJ SOLN
1.0000 mg | Freq: Once | INTRAMUSCULAR | Status: AC | PRN
  Administered 2021-06-12: 1 mg via INTRAVENOUS
  Filled 2021-06-12: qty 1

## 2021-06-12 MED ORDER — HYDROCODONE-ACETAMINOPHEN 5-325 MG PO TABS
1.0000 | ORAL_TABLET | Freq: Four times a day (QID) | ORAL | Status: DC | PRN
  Administered 2021-06-12 – 2021-06-13 (×4): 2 via ORAL
  Filled 2021-06-12 (×5): qty 2

## 2021-06-12 MED ORDER — ACETAMINOPHEN 650 MG RE SUPP: 650.0000 mg | Freq: Four times a day (QID) | RECTAL | Status: DC | PRN

## 2021-06-12 MED ORDER — LIP MEDEX EX OINT
TOPICAL_OINTMENT | Freq: Once | CUTANEOUS | Status: AC
  Administered 2021-06-12: 1 via TOPICAL
  Filled 2021-06-12: qty 7

## 2021-06-12 MED ORDER — LORAZEPAM 2 MG/ML IJ SOLN: 1.0000 mg | INTRAMUSCULAR | Status: AC | PRN

## 2021-06-12 MED ORDER — SODIUM CHLORIDE 0.9 % IV SOLN: INTRAVENOUS | Status: AC

## 2021-06-12 MED ORDER — INSULIN ASPART 100 UNIT/ML IJ SOLN
6.0000 [IU] | Freq: Once | INTRAMUSCULAR | Status: AC
  Administered 2021-06-12: 6 [IU] via SUBCUTANEOUS

## 2021-06-12 MED ORDER — HYDROMORPHONE HCL 1 MG/ML IJ SOLN
1.0000 mg | INTRAMUSCULAR | Status: DC | PRN
  Administered 2021-06-12 – 2021-06-14 (×15): 1 mg via INTRAVENOUS
  Filled 2021-06-12 (×15): qty 1

## 2021-06-12 MED ORDER — LORAZEPAM 1 MG PO TABS
1.0000 mg | ORAL_TABLET | ORAL | Status: AC | PRN
  Administered 2021-06-13: 1 mg via ORAL
  Filled 2021-06-12: qty 1

## 2021-06-12 MED ORDER — ZOLPIDEM TARTRATE 5 MG PO TABS
5.0000 mg | ORAL_TABLET | Freq: Every evening | ORAL | Status: DC | PRN
  Administered 2021-06-12 – 2021-06-15 (×4): 5 mg via ORAL
  Filled 2021-06-12 (×4): qty 1

## 2021-06-12 MED ORDER — IOHEXOL 350 MG/ML SOLN
80.0000 mL | Freq: Once | INTRAVENOUS | Status: AC | PRN
  Administered 2021-06-12: 80 mL via INTRAVENOUS

## 2021-06-12 MED ORDER — FOLIC ACID 1 MG PO TABS
1.0000 mg | ORAL_TABLET | Freq: Every day | ORAL | Status: DC
  Administered 2021-06-12 – 2021-06-16 (×5): 1 mg via ORAL
  Filled 2021-06-12 (×5): qty 1

## 2021-06-12 MED ORDER — OXYCODONE HCL 5 MG PO TABS
5.0000 mg | ORAL_TABLET | Freq: Four times a day (QID) | ORAL | Status: DC | PRN
Start: 2021-06-12 — End: 2021-06-14
  Administered 2021-06-12 – 2021-06-14 (×5): 5 mg via ORAL
  Filled 2021-06-12 (×5): qty 1

## 2021-06-12 MED ORDER — ENOXAPARIN SODIUM 60 MG/0.6ML IJ SOSY
60.0000 mg | PREFILLED_SYRINGE | Freq: Every day | INTRAMUSCULAR | Status: DC
  Filled 2021-06-12: qty 0.6

## 2021-06-12 MED ORDER — ONDANSETRON HCL 4 MG PO TABS: 4.0000 mg | ORAL_TABLET | Freq: Four times a day (QID) | ORAL | Status: DC | PRN

## 2021-06-12 NOTE — Progress Notes (Signed)
ANTICOAGULATION CONSULT NOTE - Initial Consult  Pharmacy Consult for Heparin Indication:  Splenic vein thrombosis  No Known Allergies  Patient Measurements: Height: 5\' 6"  (167.6 cm) Weight: 110.2 kg (243 lb) IBW/kg (Calculated) : 63.8 Heparin Dosing Weight: 88.9kg  Vital Signs: Temp: 98.2 F (36.8 C) (08/26 2331) Temp Source: Oral (08/26 2331) BP: 144/83 (08/27 0915) Pulse Rate: 95 (08/27 0915)  Labs: Recent Labs    06/12/21 0011 06/12/21 0855  HGB 13.8 12.7*  HCT 41.5 38.8*  PLT 538* 533*  CREATININE 1.07  --     Estimated Creatinine Clearance: 113.4 mL/min (by C-G formula based on SCr of 1.07 mg/dL).   Medical History: Past Medical History:  Diagnosis Date   Diabetes mellitus without complication (HCC)    Hypertension     Medications:  Scheduled:   folic acid  1 mg Oral Daily   heparin  2,500 Units Intravenous Once   lisinopril  10 mg Oral Daily   multivitamin with minerals  1 tablet Oral Daily   thiamine  100 mg Oral Daily   Or   thiamine  100 mg Intravenous Daily   Infusions:   sodium chloride 250 mL/hr at 06/12/21 06/14/21   Followed by   sodium chloride     heparin     PRN: acetaminophen **OR** acetaminophen, HYDROcodone-acetaminophen, HYDROmorphone (DILAUDID) injection, LORazepam **OR** LORazepam, ondansetron **OR** ondansetron (ZOFRAN) IV, oxyCODONE, zolpidem  Assessment: 35 yo male with recently-diagnosed DM2 presents with N/V, abdominal pain.  CT scan shows splenic vein thrombosis.  Pharmacy consulted to dose IV heparin.    Goal of Therapy:  Heparin level 0.3-0.7 units/ml Monitor platelets by anticoagulation protocol: Yes   Plan:  Give 2500 units bolus x 1 Start heparin infusion at 1700 units/hr Check anti-Xa level in 6 hours and daily while on heparin Continue to monitor H&H and platelets  20, PharmD, BCPS Pharmacy: [phone] 06/12/2021,9:49 AM

## 2021-06-12 NOTE — ED Provider Notes (Signed)
[facility] DEPT Provider Note   CSN: [id number] Arrival date & time: 06/11/21  2320     History Chief Complaint  Patient presents with   Abdominal Pain    Edward Ware is a 35 y.o. male.  Patient presents with abdominal pain, nausea and vomiting.  Patient was recently hospitalized for alcoholic pancreatitis.  He reports that he has been having persistent pain since he left the hospital.  Pain has been severe, he has not been able to sleep.  Patient reports that he has not had any alcohol use since he left the hospital.      History reviewed. No pertinent past medical history.  Patient Active Problem List   Diagnosis Date Noted   DKA (diabetic ketoacidosis) ([place]) 06/03/2021   ETOH abuse 06/03/2021   Tobacco abuse 06/03/2021   Pancreatitis 06/03/2021   Dehydration 06/03/2021    History reviewed. No pertinent surgical history.     Family History  Problem Relation Age of Onset   Diabetes Mother    Hypertension Mother    Diabetes Other     Social History   Tobacco Use   Smoking status: Former   Smokeless tobacco: Never  Substance Use Topics   Alcohol use: Yes    Comment: 1 pint a night of alcohol   Drug use: No    Home Medications Prior to Admission medications   Medication Sig Start Date End Date Taking? Authorizing Provider  Blood Glucose Monitoring Suppl (TRUE METRIX METER) w/Device KIT use up to 4 times daily as directed 06/08/21  Yes Lama, Marge Duncans, MD  folic acid (FOLVITE) 1 MG tablet Take 1 tablet (1 mg total) by mouth daily. 06/09/21  Yes Oswald Hillock, MD  glucose blood test strip use up to 4 times daily as directed. 06/08/21  Yes Oswald Hillock, MD  insulin aspart protamine - aspart (NOVOLOG 70/30 MIX) (70-30) 100 UNIT/ML FlexPen Inject 15 Units into the skin 2 (two) times daily with a meal. 06/08/21  Yes Darrick Meigs, Marge Duncans, MD  Insulin Pen Needle 31G X 5 MM MISC use as directed 06/08/21  Yes Darrick Meigs, Marge Duncans, MD  lisinopril  (ZESTRIL) 10 MG tablet Take 1 tablet (10 mg total) by mouth daily. 06/08/21 06/08/22 Yes Oswald Hillock, MD  oxyCODONE (OXY IR/ROXICODONE) 5 MG immediate release tablet Take 1 tablet (5 mg total) by mouth every 6 (six) hours as needed for moderate pain. 06/08/21  Yes Oswald Hillock, MD  TRUEplus Lancets 28G MISC use up to 4 times daily as directed. 06/08/21  Yes Oswald Hillock, MD  atorvastatin (LIPITOR) 20 MG tablet Take 1 tablet (20 mg total) by mouth daily. 06/08/21 06/08/22  Oswald Hillock, MD  insulin aspart protamine - aspart (NOVOLOG 70/30 MIX) (70-30) 100 UNIT/ML FlexPen Inject 15 units into the skin 2 times daily 06/08/21   Oswald Hillock, MD  lisinopril (ZESTRIL) 10 MG tablet Take 1 tablet (10 mg total) by mouth daily. Patient not taking: Reported on 06/12/2021 06/08/21   Oswald Hillock, MD  metFORMIN (GLUCOPHAGE) 500 MG tablet Take 1 tablet (500 mg total) by mouth daily with breakfast. 06/08/21 06/08/22  Oswald Hillock, MD  metFORMIN (GLUCOPHAGE) 500 MG tablet Take 1 tablet (500 mg total) by mouth daily. Patient not taking: Reported on 06/12/2021 06/08/21   Oswald Hillock, MD  thiamine 100 MG tablet Take 1 tablet (100 mg total) by mouth daily. 06/09/21   Oswald Hillock, MD  Allergies    Patient has no known allergies.  Review of Systems   Review of Systems  Gastrointestinal:  Positive for abdominal pain and nausea.  All other systems reviewed and are negative.  Physical Exam Updated Vital Signs BP (!) 143/85   Pulse 98   Temp 98.2 F (36.8 C) (Oral)   Resp 17   Ht _0  (1.676 m)   Wt 110.2 kg   SpO2 97%   BMI 39.22 kg/m   Physical Exam Vitals and nursing note reviewed.  Constitutional:      General: He is not in acute distress.    Appearance: Normal appearance. He is well-developed.  HENT:     Head: Normocephalic and atraumatic.     Right Ear: Hearing normal.     Left Ear: Hearing normal.     Nose: Nose normal.  Eyes:     Conjunctiva/sclera: Conjunctivae normal.     Pupils:  Pupils are equal, round, and reactive to light.  Cardiovascular:     Rate and Rhythm: Regular rhythm.     Heart sounds: S1 normal and S2 normal. No murmur heard.   No friction rub. No gallop.  Pulmonary:     Effort: Pulmonary effort is normal. No respiratory distress.     Breath sounds: Normal breath sounds.  Chest:     Chest wall: No tenderness.  Abdominal:     General: Bowel sounds are normal.     Palpations: Abdomen is soft.     Tenderness: There is generalized abdominal tenderness. There is no guarding or rebound. Negative signs include Murphy's sign and McBurney's sign.     Hernia: No hernia is present.  Musculoskeletal:        General: Normal range of motion.     Cervical back: Normal range of motion and neck supple.  Skin:    General: Skin is warm and dry.     Findings: No rash.  Neurological:     Mental Status: He is alert and oriented to person, place, and time.     GCS: GCS eye subscore is 4. GCS verbal subscore is 5. GCS motor subscore is 6.     Cranial Nerves: No cranial nerve deficit.     Sensory: No sensory deficit.     Coordination: Coordination normal.  Psychiatric:        Speech: Speech normal.        Behavior: Behavior normal.        Thought Content: Thought content normal.    ED Results / Procedures / Treatments   Labs (all labs ordered are listed, but only abnormal results are displayed) Labs Reviewed  LIPASE, BLOOD - Abnormal; Notable for the following components:      Result Value   Lipase 129 (*)    All other components within normal limits  COMPREHENSIVE METABOLIC PANEL - Abnormal; Notable for the following components:   Glucose, Bld 270 (*)    Total Protein 8.6 (*)    Albumin 3.1 (*)    All other components within normal limits  URINALYSIS, ROUTINE W REFLEX MICROSCOPIC - Abnormal; Notable for the following components:   Color, Urine AMBER (*)    APPearance HAZY (*)    Glucose, UA 50 (*)    Ketones, ur 20 (*)    Protein, ur 100 (*)    All  other components within normal limits  CBC WITH DIFFERENTIAL/PLATELET - Abnormal; Notable for the following components:   WBC 12.2 (*)    Platelets 538 (*)  Neutro Abs 7.8 (*)    Monocytes Absolute 1.3 (*)    Abs Immature Granulocytes 0.59 (*)    All other components within normal limits  CBG MONITORING, ED - Abnormal; Notable for the following components:   Glucose-Capillary 281 (*)    All other components within normal limits    EKG None  Radiology CT ABDOMEN PELVIS WO CONTRAST  Result Date: 06/12/2021 CLINICAL DATA:  Evaluate for acute pancreatitis. New diagnosis of diabetes. EXAM: CT ABDOMEN AND PELVIS WITHOUT CONTRAST TECHNIQUE: Multidetector CT imaging of the abdomen and pelvis was performed following the standard protocol without IV contrast. COMPARISON:  06/03/2021 FINDINGS: Lower chest: Subsegmental atelectasis identified within the anterior left lower lobe Hepatobiliary: Diffuse hepatic steatosis identified. No focal liver abnormality. Gallbladder appears unremarkable. No bile duct dilatation. Pancreas: Interval progression changes secondary to acute pancreatitis. There has been significant interval increase in edema/size of tail of pancreas. Progressive peripancreatic fat stranding and fluid now extends throughout the mesentery and into the left retroperitoneum. Assessment for loculated fluid collection and pancreatic necrosis limited due to lack of IV contrast material. No obvious drainable fluid collections identified at this time. Spleen: Normal in size without focal abnormality. Adrenals/Urinary Tract: Adrenal glands are unremarkable. Kidneys are normal, without renal calculi, focal lesion, or hydronephrosis. Bladder is unremarkable. Stomach/Bowel: No gastric distension. No abnormal bowel wall thickening, inflammation or distension. The appendix is visualized and appears normal. Vascular/Lymphatic: No significant vascular findings are present. No enlarged abdominal or pelvic lymph  nodes. Reproductive: Prostate is unremarkable. Other: No abdominal wall hernia or abnormality. Musculoskeletal: No acute or significant osseous findings. IMPRESSION: 1. Interval progression of acute pancreatitis. Marked progressive edema involves the tail of pancreas with significant interval increase in peripancreatic inflammatory fat stranding and fluid. 2. Assessment for loculated fluid collection and or pancreatic necrosis limited by lack of IV contrast material. No drainable fluid collection identified within these limitations. 3. Hepatic steatosis. Electronically Signed   By: Kerby Moors M.D.   On: 06/12/2021 05:23    Procedures Procedures   Medications Ordered in ED Medications  sodium chloride 0.9 % bolus 1,000 mL (1,000 mLs Intravenous New Bag/Given 06/12/21 0316)  iohexol (OMNIPAQUE) 350 MG/ML injection 80 mL (80 mLs Intravenous Contrast Given 06/12/21 0428)  HYDROmorphone (DILAUDID) injection 1 mg (1 mg Intravenous Given 06/12/21 0459)  ondansetron (ZOFRAN) injection 4 mg (4 mg Intravenous Given 06/12/21 0459)    ED Course  I have reviewed the triage vital signs and the nursing notes.  Pertinent labs & imaging results that were available during my care of the patient were reviewed by me and considered in my medical decision making (see chart for details).    MDM Rules/Calculators/A&P                           Patient presents for worsening abdominal pain.  Patient was hospitalized August 8 through August 23 for acute alcoholic pancreatitis.  He reports that since he was discharged he has had considerable worsening of his pain.  He has had a slight bump of his lipase but it is far below the maximum during hospitalization.  Patient treated with IV fluids and analgesia.  Repeat CT scan performed.  Unfortunately, patient refused IV contrast for unknown reasons.  CT scan has been reviewed by radiology and indicates significant progression of his pancreatitis.  Will require repeat  hospitalization.  Final Clinical Impression(s) / ED Diagnoses Final diagnoses:  Alcohol-induced acute pancreatitis, unspecified complication status  Rx / DC Orders ED Discharge Orders     None        Dirk Vanaman, Gwenyth Allegra, MD 06/12/21 [phone]

## 2021-06-12 NOTE — ED Notes (Signed)
Pt transported to CT ?

## 2021-06-12 NOTE — Consult Note (Signed)
[facility] Consultation Note  Referring Provider: [facility] Primary Care Physician:  Patient, No Pcp Per (Inactive) Primary Gastroenterologist:  Althia Forts  Reason for Consultation:  abdominal pain  HPI: Edward Ware is a 35 y.o. male admitted for abdominal pain.  Started about 10 days ago, progressive epigastric discomfort accompanied by nausea and difficulty sleeping.  Had admission for this last week, CT with pancreatitis, readmitted last night, and CT now with worsening pancreatitis, acute pancreatic fluid collection, suspected incipient necrosis at tail, and splenic vein thrombosis.  Last alcohol about 2 weeks ago, few days prior to his last admission.   Past Medical History:  Diagnosis Date   Diabetes mellitus without complication ([place])    Hypertension     History reviewed. No pertinent surgical history.  Prior to Admission medications   Medication Sig Start Date End Date Taking? Authorizing Provider  Blood Glucose Monitoring Suppl (TRUE METRIX METER) w/Device KIT use up to 4 times daily as directed 06/08/21  Yes Lama, Marge Duncans, MD  folic acid (FOLVITE) 1 MG tablet Take 1 tablet (1 mg total) by mouth daily. 06/09/21  Yes Oswald Hillock, MD  glucose blood test strip use up to 4 times daily as directed. 06/08/21  Yes Oswald Hillock, MD  insulin aspart protamine - aspart (NOVOLOG 70/30 MIX) (70-30) 100 UNIT/ML FlexPen Inject 15 Units into the skin 2 (two) times daily with a meal. 06/08/21  Yes Darrick Meigs, Marge Duncans, MD  Insulin Pen Needle 31G X 5 MM MISC use as directed 06/08/21  Yes Darrick Meigs, Marge Duncans, MD  lisinopril (ZESTRIL) 10 MG tablet Take 1 tablet (10 mg total) by mouth daily. 06/08/21 06/08/22 Yes Oswald Hillock, MD  oxyCODONE (OXY IR/ROXICODONE) 5 MG immediate release tablet Take 1 tablet (5 mg total) by mouth every 6 (six) hours as needed for moderate pain. 06/08/21  Yes Oswald Hillock, MD  TRUEplus Lancets 28G MISC use up to 4 times daily as directed. 06/08/21  Yes Oswald Hillock, MD  atorvastatin (LIPITOR) 20 MG tablet Take 1 tablet (20 mg total) by mouth daily. 06/08/21 06/08/22  Oswald Hillock, MD  insulin aspart protamine - aspart (NOVOLOG 70/30 MIX) (70-30) 100 UNIT/ML FlexPen Inject 15 units into the skin 2 times daily 06/08/21   Oswald Hillock, MD  lisinopril (ZESTRIL) 10 MG tablet Take 1 tablet (10 mg total) by mouth daily. Patient not taking: Reported on 06/12/2021 06/08/21   Oswald Hillock, MD  metFORMIN (GLUCOPHAGE) 500 MG tablet Take 1 tablet (500 mg total) by mouth daily with breakfast. 06/08/21 06/08/22  Oswald Hillock, MD  metFORMIN (GLUCOPHAGE) 500 MG tablet Take 1 tablet (500 mg total) by mouth daily. Patient not taking: Reported on 06/12/2021 06/08/21   Oswald Hillock, MD  thiamine 100 MG tablet Take 1 tablet (100 mg total) by mouth daily. 06/09/21   Oswald Hillock, MD    Current Facility-Administered Medications  Medication Dose Route Frequency Provider Last Rate Last Admin   0.9 %  sodium chloride infusion   Intravenous Continuous Nicoletta Dress, Na, MD 250 mL/hr at 06/12/21 0838 New Bag at 06/12/21 1856   Followed by   0.9 %  sodium chloride infusion   Intravenous Continuous Nicoletta Dress, Na, MD       acetaminophen (TYLENOL) tablet 650 mg  650 mg Oral Q6H PRN Nicoletta Dress, Na, MD       Or   acetaminophen (TYLENOL) suppository 650 mg  650 mg Rectal Q6H PRN Charlann Lange, MD  folic acid (FOLVITE) tablet 1 mg  1 mg Oral Daily Li, Na, MD       heparin ADULT infusion 100 units/mL (25000 units/263mL)  1,700 Units/hr Intravenous Continuous Emiliano Dyer, RPH       heparin bolus via infusion 2,500 Units  2,500 Units Intravenous Once Emiliano Dyer, [facility]       HYDROcodone-acetaminophen (NORCO/VICODIN) 5-325 MG per tablet 1-2 tablet  1-2 tablet Oral Q6H PRN Nicoletta Dress, Na, MD       HYDROmorphone (DILAUDID) injection 1 mg  1 mg Intravenous Q3H PRN Nicoletta Dress, Na, MD       lisinopril (ZESTRIL) tablet 10 mg  10 mg Oral Daily Nicoletta Dress, Na, MD       LORazepam (ATIVAN) tablet 1-4 mg  1-4 mg Oral Q1H PRN Nicoletta Dress, Na, MD        Or   LORazepam (ATIVAN) injection 1-4 mg  1-4 mg Intravenous Q1H PRN Nicoletta Dress, Na, MD       multivitamin with minerals tablet 1 tablet  1 tablet Oral Daily Nicoletta Dress, Na, MD       ondansetron (ZOFRAN) tablet 4 mg  4 mg Oral Q6H PRN Nicoletta Dress, Na, MD       Or   ondansetron (ZOFRAN) injection 4 mg  4 mg Intravenous Q6H PRN Nicoletta Dress, Na, MD       oxyCODONE (Oxy IR/ROXICODONE) immediate release tablet 5 mg  5 mg Oral Q6H PRN Nicoletta Dress, Na, MD       thiamine tablet 100 mg  100 mg Oral Daily Li, Na, MD       Or   thiamine (B-1) injection 100 mg  100 mg Intravenous Daily Li, Na, MD       zolpidem (AMBIEN) tablet 5 mg  5 mg Oral QHS PRN,MR X 1 Li, Na, MD       Current Outpatient Medications  Medication Sig Dispense Refill   Blood Glucose Monitoring Suppl (TRUE METRIX METER) w/Device KIT use up to 4 times daily as directed 1 kit 0   folic acid (FOLVITE) 1 MG tablet Take 1 tablet (1 mg total) by mouth daily. 30 tablet 0   glucose blood test strip use up to 4 times daily as directed. 100 each 0   insulin aspart protamine - aspart (NOVOLOG 70/30 MIX) (70-30) 100 UNIT/ML FlexPen Inject 15 Units into the skin 2 (two) times daily with a meal. 15 mL 11   Insulin Pen Needle 31G X 5 MM MISC use as directed 100 each 0   lisinopril (ZESTRIL) 10 MG tablet Take 1 tablet (10 mg total) by mouth daily. 30 tablet 11   oxyCODONE (OXY IR/ROXICODONE) 5 MG immediate release tablet Take 1 tablet (5 mg total) by mouth every 6 (six) hours as needed for moderate pain. 20 tablet 0   TRUEplus Lancets 28G MISC use up to 4 times daily as directed. 100 each 0   atorvastatin (LIPITOR) 20 MG tablet Take 1 tablet (20 mg total) by mouth daily. 30 tablet 11   insulin aspart protamine - aspart (NOVOLOG 70/30 MIX) (70-30) 100 UNIT/ML FlexPen Inject 15 units into the skin 2 times daily 3 mL 0   lisinopril (ZESTRIL) 10 MG tablet Take 1 tablet (10 mg total) by mouth daily. (Patient not taking: Reported on 06/12/2021) 30 tablet 0   metFORMIN (GLUCOPHAGE) 500 MG tablet  Take 1 tablet (500 mg total) by mouth daily with breakfast. 30 tablet 11   metFORMIN (GLUCOPHAGE) 500 MG tablet Take 1 tablet (500 mg  total) by mouth daily. (Patient not taking: Reported on 06/12/2021) 30 tablet 0   thiamine 100 MG tablet Take 1 tablet (100 mg total) by mouth daily. 30 tablet 0    Allergies as of 06/11/2021   (No Known Allergies)    Family History  Problem Relation Age of Onset   Diabetes Mother    Hypertension Mother    Diabetes Other     Social History   Socioeconomic History   Marital status: Single    Spouse name: Not on file   Number of children: Not on file   Years of education: Not on file   Highest education level: Not on file  Occupational History   Not on file  Tobacco Use   Smoking status: Former   Smokeless tobacco: Never  Substance and Sexual Activity   Alcohol use: Yes    Comment: 1 pint a night of alcohol   Drug use: No   Sexual activity: Yes  Other Topics Concern   Not on file  Social History Narrative   Not on file   Social Determinants of Health   Financial Resource Strain: Not on file  Food Insecurity: Not on file  Transportation Needs: Not on file  Physical Activity: Not on file  Stress: Not on file  Social Connections: Not on file  Intimate Partner Violence: Not on file    Review of Systems: As per HPI, all others negative  Physical Exam: Vital signs in last 24 hours: Temp:  [98.2 F (36.8 C)] 98.2 F (36.8 C) (08/26 2331) Pulse Rate:  [88-116] 95 (08/27 0915) Resp:  [16-20] 18 (08/27 0915) BP: (107-160)/(76-98) 144/83 (08/27 0915) SpO2:  [95 %-99 %] 95 % (08/27 0915) Weight:  [110.2 kg] 110.2 kg (08/26 2331)   General:   Alert, overweight, Well-developed, well-nourished, pleasant and cooperative in NAD Head:  Normocephalic and atraumatic. Eyes:  Sclera clear, no icterus.   Conjunctiva pink. Ears:  Normal auditory acuity. Nose:  No deformity, discharge,  or lesions. Mouth:  No deformity or lesions.  Oropharynx  pink & moist. Neck:  Supple; no masses or thyromegaly. Abdomen:  Soft, protuberant, mild epigastric tenderness and nondistended. No masses, hepatosplenomegaly or hernias noted. Normal bowel sounds, without guarding, and without rebound.     Msk:  Symmetrical without gross deformities. Normal posture. Pulses:  Normal pulses noted. Extremities:  Without clubbing or edema. Neurologic:  Alert and  oriented x4;  grossly normal neurologically. Skin:  Intact without significant lesions or rashes. Psych:  Alert and cooperative. Normal mood and affect.   Lab Results: Recent Labs    06/12/21 0011 06/12/21 0855  WBC 12.2* 10.3  HGB 13.8 12.7*  HCT 41.5 38.8*  PLT 538* 533*   BMET Recent Labs    06/12/21 0011 06/12/21 0855  NA 135 133*  K 3.7 3.7  CL 98 97*  CO2 24 26  GLUCOSE 270* 253*  BUN 9 9  CREATININE 1.07 1.05  CALCIUM 9.3 8.6*   LFT Recent Labs    06/12/21 0855  PROT 7.9  ALBUMIN 2.6*  AST 28  ALT 37  ALKPHOS 106  BILITOT 0.8   PT/INR No results for input(s): LABPROT, INR in the last 72 hours.  Studies/Results: CT ABDOMEN PELVIS WO CONTRAST  Result Date: 06/12/2021 CLINICAL DATA:  Evaluate for acute pancreatitis. New diagnosis of diabetes. EXAM: CT ABDOMEN AND PELVIS WITHOUT CONTRAST TECHNIQUE: Multidetector CT imaging of the abdomen and pelvis was performed following the standard protocol without IV contrast. COMPARISON:  06/03/2021 FINDINGS: Lower chest: Subsegmental atelectasis identified within the anterior left lower lobe Hepatobiliary: Diffuse hepatic steatosis identified. No focal liver abnormality. Gallbladder appears unremarkable. No bile duct dilatation. Pancreas: Interval progression changes secondary to acute pancreatitis. There has been significant interval increase in edema/size of tail of pancreas. Progressive peripancreatic fat stranding and fluid now extends throughout the mesentery and into the left retroperitoneum. Assessment for loculated fluid  collection and pancreatic necrosis limited due to lack of IV contrast material. No obvious drainable fluid collections identified at this time. Spleen: Normal in size without focal abnormality. Adrenals/Urinary Tract: Adrenal glands are unremarkable. Kidneys are normal, without renal calculi, focal lesion, or hydronephrosis. Bladder is unremarkable. Stomach/Bowel: No gastric distension. No abnormal bowel wall thickening, inflammation or distension. The appendix is visualized and appears normal. Vascular/Lymphatic: No significant vascular findings are present. No enlarged abdominal or pelvic lymph nodes. Reproductive: Prostate is unremarkable. Other: No abdominal wall hernia or abnormality. Musculoskeletal: No acute or significant osseous findings. IMPRESSION: 1. Interval progression of acute pancreatitis. Marked progressive edema involves the tail of pancreas with significant interval increase in peripancreatic inflammatory fat stranding and fluid. 2. Assessment for loculated fluid collection and or pancreatic necrosis limited by lack of IV contrast material. No drainable fluid collection identified within these limitations. 3. Hepatic steatosis. Electronically Signed   By: Kerby Moors M.D.   On: 06/12/2021 05:23   CT ABDOMEN PELVIS W CONTRAST  Result Date: 06/12/2021 CLINICAL DATA:  Pancreatitis. EXAM: CT ABDOMEN AND PELVIS WITH CONTRAST TECHNIQUE: Multidetector CT imaging of the abdomen and pelvis was performed using the standard protocol following bolus administration of intravenous contrast. CONTRAST:  <See Chart> OMNIPAQUE IOHEXOL 350 MG/ML SOLN COMPARISON:  Earlier today FINDINGS: Lower chest: Subsegmental atelectasis noted in the left lung base. Hepatobiliary: No focal liver abnormality. Hepatic steatosis noted. Gallbladder normal. No bile duct dilatation. Pancreas: Changes of acute pancreatitis identified as described previously. This demonstrates significant progression when compared with the  previous exam. There is diminished enhancement of the distal tail of pancreas which may reflect early necrosis. There is extensive peripancreatic inflammatory fat stranding with free fluid extending into the left retroperitoneum. Developing fluid collection along the greater curvature of the stomach is identified measuring 8.4 x 4.0 x 2.4 cm. This does not have a well-defined enhancing rind suggesting immature fluid collection/pseudocyst. Spleen: Normal in size without focal abnormality. Adrenals/Urinary Tract: Normal adrenal glands. No kidney mass or hydronephrosis. Urinary bladder appears normal. Stomach/Bowel: No bowel dilatation, wall thickening or inflammation identified. The appendix is visualized and appears normal. Vascular/Lymphatic: Normal caliber of the abdominal aorta. The portal vein and portal venous confluence are patent. 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 Increased left upper quadrant collaterals compared with 06/03/2021. Reproductive: Prostate is unremarkable. Other: Free fluid is identified extending along left retroperitoneum. Musculoskeletal: No acute or significant osseous findings. IMPRESSION: 1. Imaging findings compatible with acute pancreatitis. Significantly increased from 06/03/2021. There is diminished enhancement of the distal tail of pancreas which may reflect early necrosis. 2. Developing fluid collection along the greater curvature of the stomach is identified measuring 8.4 x 4.0 x 2.4 cm. This does not have a well-defined enhancing rind suggesting immature fluid collection/pseudocyst. 3. Thrombosis of the splenic vein with increased left upper quadrant collaterals compared with 06/03/2021. 4. Hepatic steatosis. Electronically Signed   By: Kerby Moors M.D.   On: 06/12/2021 09:01    Impression:   Acute pancreatitis, severe, complicated by acute pancreatic fluid collection, early necrosis, splenic vein thrombosis. Alcohol abuse.  Alcohol likely etiology of  pancreatitis. Abdominal pain, from #1 above.  Plan:   Clear liquids. Judicious analgesics. IV fluids, antiemetics as needed. Hematology consult for assistance with splenic vein thrombosis management; collaterals suggest chronicity and there is not insignificant risk of bleeding into the fluid collection, but will ask hematology to help US guide management. Importance henceforth of lifelong alcohol abstinence reviewed with patient in detail. [facility] will follow.     LOS: 0 days   Corrie Reder M  06/12/2021, 10:39 AM  Cell [phone] If no answer or after 5 PM call [phone]

## 2021-06-12 NOTE — Progress Notes (Signed)
Hematology  Patient was not seen.  I reviewed the chart and based on that here are my recommendations.  Full consult to follow tomorrow.  Acute pancreatitis: Secondary to alcohol abuse previous hospitalization from 06/03/2021 to 06/08/2021 with diabetic ketoacidosis and acute pancreatitis. 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  Recommendation: Splenic vein thrombosis: Patients with chronic pancreatitis can develop splenic vein thrombosis and 12% of the time.  There are no clear-cut recommendations for treatment with anticoagulation because of the high risk of bleeding either because of portal hypertension and gastric varices or because of bleeding into the necrotic pancreas.  Plan: Hold off on anticoagulation at this time. I agree with Dr. 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

## 2021-06-12 NOTE — H&P (Addendum)
History and Physical    Edward Ware QZE:[id number] DOB: 1986-02-28 DOA: 06/11/2021  PCP: Patient, No Pcp Per (Inactive) Patient coming from: home  Chief Complaint: nausea and abdominal pain  HPI: Edward Ware is a 35 y.o. male with medical history significant of recently diagnosed T2DM, history of alcohol abuse and tobacco abuse, previous history of marijuana abuse who presented with nausea and abdominal pain.  Patient was recently hospitalized between 8/18 and 06/08/2021 for DKA secondary to newly diagnosed diabetes and acute pancreatitis.  Patient reports that that he has been having persistent nausea and abdominal pain since he left the hospital.  His abdomen pain was located to left upper abdomen, constant pain, sharp pain, moderate to severe pain radiating to the left flank area.  No aggravating or alleviating factors.  Denies fevers, chills, vomiting, diarrhea.  Denies shortness of breath, wheezing, cough, chest pain, dysuria, urinary frequency or urgency.  Patient came to the ED for further evaluation.  In the emergency room, his vitals showed temperature 98.2, pulse 116, RR 16, BP 152/97 with room air O2 sats 95%.  The labs showed BUN 9, creatinine 1.07, lipase 129, normal LFTs, WBC 12.2, HCT 41.5, glucose 270.  CT abdomen without contrast showed "Interval progression of acute pancreatitis. Marked progressive edema involves the tail of pancreas with significant interval increase in peripancreatic inflammatory fat stranding and fluid".  Patient initially declined IV contrast CT and now agreed to have IV contrast CT which is pending currently.  Patient received Zofran, Dilaudid and IV fluids in ED.   Review of Systems: As per HPI otherwise 10 point review of systems negative.  Review of Systems Otherwise negative except as per HPI, including: General: Denies fever, chills, night sweats or unintended weight loss. Resp: Denies cough, wheezing, shortness of breath. Cardiac: Denies  chest pain, palpitations, orthopnea, paroxysmal nocturnal dyspnea. GI: Denies vomiting, diarrhea or constipation.  Positive for nausea and abdominal pain. GU: Denies dysuria, frequency, hesitancy or incontinence MS: Denies muscle aches, joint pain or swelling Neuro: Denies headache, neurologic deficits (focal weakness, numbness, tingling), abnormal gait Psych: Denies anxiety, depression, SI/HI/AVH Skin: Denies new rashes or lesions ID: Denies sick contacts, exotic exposures, travel  Past Medical History:  Diagnosis Date   Diabetes mellitus without complication ([place])    Hypertension     History reviewed. No pertinent surgical history.  SOCIAL HISTORY:  reports that he has quit smoking. He has never used smokeless tobacco. He reports current alcohol use. He reports that he does not use drugs.  No Known Allergies  FAMILY HISTORY: Family History  Problem Relation Age of Onset   Diabetes Mother    Hypertension Mother    Diabetes Other      Prior to Admission medications   Medication Sig Start Date End Date Taking? Authorizing Provider  Blood Glucose Monitoring Suppl (TRUE METRIX METER) w/Device KIT use up to 4 times daily as directed 06/08/21  Yes Lama, Marge Duncans, MD  folic acid (FOLVITE) 1 MG tablet Take 1 tablet (1 mg total) by mouth daily. 06/09/21  Yes Oswald Hillock, MD  glucose blood test strip use up to 4 times daily as directed. 06/08/21  Yes Oswald Hillock, MD  insulin aspart protamine - aspart (NOVOLOG 70/30 MIX) (70-30) 100 UNIT/ML FlexPen Inject 15 Units into the skin 2 (two) times daily with a meal. 06/08/21  Yes Oswald Hillock, MD  Insulin Pen Needle 31G X 5 MM MISC use as directed 06/08/21  Yes Darrick Meigs, Marge Duncans,  MD  lisinopril (ZESTRIL) 10 MG tablet Take 1 tablet (10 mg total) by mouth daily. 06/08/21 06/08/22 Yes Oswald Hillock, MD  oxyCODONE (OXY IR/ROXICODONE) 5 MG immediate release tablet Take 1 tablet (5 mg total) by mouth every 6 (six) hours as needed for moderate pain. 06/08/21   Yes Oswald Hillock, MD  TRUEplus Lancets 28G MISC use up to 4 times daily as directed. 06/08/21  Yes Oswald Hillock, MD  atorvastatin (LIPITOR) 20 MG tablet Take 1 tablet (20 mg total) by mouth daily. 06/08/21 06/08/22  Oswald Hillock, MD  insulin aspart protamine - aspart (NOVOLOG 70/30 MIX) (70-30) 100 UNIT/ML FlexPen Inject 15 units into the skin 2 times daily 06/08/21   Oswald Hillock, MD  lisinopril (ZESTRIL) 10 MG tablet Take 1 tablet (10 mg total) by mouth daily. Patient not taking: Reported on 06/12/2021 06/08/21   Oswald Hillock, MD  metFORMIN (GLUCOPHAGE) 500 MG tablet Take 1 tablet (500 mg total) by mouth daily with breakfast. 06/08/21 06/08/22  Oswald Hillock, MD  metFORMIN (GLUCOPHAGE) 500 MG tablet Take 1 tablet (500 mg total) by mouth daily. Patient not taking: Reported on 06/12/2021 06/08/21   Oswald Hillock, MD  thiamine 100 MG tablet Take 1 tablet (100 mg total) by mouth daily. 06/09/21   Oswald Hillock, MD    Physical Exam: Vitals:   06/12/21 0730 06/12/21 0835 06/12/21 0836 06/12/21 0845  BP: 107/76 (!) 141/95  (!) 148/97  Pulse: 96 92 92 88  Resp: $Remo'18 19  18  'DRFQl$ Temp:      TempSrc:      SpO2: 95% 99%  98%  Weight:      Height:          Constitutional: NAD, calm, comfortable.  Acutely ill-appearing. Eyes: PERRL, lids and conjunctivae normal ENMT: Mucous membranes are dry.  Posterior pharynx clear of any exudate or lesions.Normal dentition.  Neck: normal, supple, no masses, no thyromegaly Respiratory: clear to auscultation bilaterally, no wheezing, no crackles. Normal respiratory effort. No accessory muscle use.  Cardiovascular: Tachycardia.  Regular rate and rhythm, no murmurs / rubs / gallops. No extremity edema. 2+ pedal pulses. No carotid bruits.  Abdomen: Soft.  Hypoactive bowel sounds.  Tenderness to epigastric and left upper quadrant abdomen area without rebound tenderness or guarding.  No masses palpated. No hepatosplenomegaly.  Musculoskeletal: no clubbing / cyanosis. No joint  deformity upper and lower extremities. Good ROM, no contractures. Normal muscle tone.  Skin: no rashes, lesions, ulcers. No induration Neurologic: CN 2-12 grossly intact. Sensation intact, DTR normal. Strength 5/5 in all 4.  Psychiatric: Normal judgment and insight. Alert and oriented x 3. Normal mood.     Labs on Admission: I have personally reviewed following labs and imaging studies  CBC: Recent Labs  Lab 06/06/21 0300 06/07/21 0422 06/08/21 0435 06/12/21 0011 06/12/21 0855  WBC 14.3* 10.8* 10.0 12.2* 10.3  NEUTROABS  --   --   --  7.8*  --   HGB 15.0 12.6* 12.9* 13.8 12.7*  HCT 44.0 37.7* 38.1* 41.5 38.8*  MCV 90.7 91.5 90.1 90.6 91.7  PLT 172 187 213 538* 784*   Basic Metabolic Panel: Recent Labs  Lab 06/06/21 0300 06/07/21 0422 06/08/21 0435 06/12/21 0011  Tonye Tancredi 130* 134* 135 135  K 3.7 3.8 3.7 3.7  CL 98 100 100 98  CO2 $Re'23 24 25 24  'PeC$ GLUCOSE 217* 218* 199* 270*  BUN $Re'11 7 8 9  'ziY$ CREATININE 0.88 0.76 0.77 1.07  CALCIUM 7.8* 8.1* 8.3* 9.3   GFR: Estimated Creatinine Clearance: 113.4 mL/min (by C-G formula based on SCr of 1.07 mg/dL). Liver Function Tests: Recent Labs  Lab 06/06/21 0300 06/07/21 0422 06/08/21 0435 06/12/21 0011  AST 30 21 33 35  ALT 23 21 26  42  ALKPHOS 60 63 85 122  BILITOT 1.3* 1.4* 1.2 0.9  PROT 6.0* 6.0* 6.0* 8.6*  ALBUMIN 2.8* 2.6* 2.3* 3.1*   Recent Labs  Lab 06/06/21 0300 06/07/21 0422 06/08/21 0435 06/12/21 0011  LIPASE 472* 100* 84* 129*   No results for input(s): AMMONIA in the last 168 hours. Coagulation Profile: No results for input(s): INR, PROTIME in the last 168 hours. Cardiac Enzymes: No results for input(s): CKTOTAL, CKMB, CKMBINDEX, TROPONINI in the last 168 hours. BNP (last 3 results) No results for input(s): PROBNP in the last 8760 hours. HbA1C: No results for input(s): HGBA1C in the last 72 hours. CBG: Recent Labs  Lab 06/07/21 1635 06/07/21 2118 06/08/21 0734 06/08/21 1115 06/12/21 0008  GLUCAP 213*  237* 178* 196* 281*   Lipid Profile: No results for input(s): CHOL, HDL, LDLCALC, TRIG, CHOLHDL, LDLDIRECT in the last 72 hours. Thyroid Function Tests: No results for input(s): TSH, T4TOTAL, FREET4, T3FREE, THYROIDAB in the last 72 hours. Anemia Panel: No results for input(s): VITAMINB12, FOLATE, FERRITIN, TIBC, IRON, RETICCTPCT in the last 72 hours. Urine analysis:    Component Value Date/Time   COLORURINE AMBER (A) 06/12/2021 0012   APPEARANCEUR HAZY (A) 06/12/2021 0012   LABSPEC 1.030 06/12/2021 0012   PHURINE 5.0 06/12/2021 0012   GLUCOSEU 50 (A) 06/12/2021 0012   HGBUR NEGATIVE 06/12/2021 0012   BILIRUBINUR NEGATIVE 06/12/2021 0012   KETONESUR 20 (A) 06/12/2021 0012   PROTEINUR 100 (A) 06/12/2021 0012   UROBILINOGEN 0.2 10/08/2013 1230   NITRITE NEGATIVE 06/12/2021 0012   LEUKOCYTESUR NEGATIVE 06/12/2021 0012   Sepsis Labs: !!!!!!!!!!!!!!!!!!!!!!!!!!!!!!!!!!!!!!!!!!!! @LABRCNTIP (procalcitonin:4,lacticidven:4) ) Recent Results (from the past 240 hour(s))  Resp Panel by RT-PCR (Flu A&B, Covid) Nasopharyngeal Swab     Status: None   Collection Time: 06/03/21  9:05 PM   Specimen: Nasopharyngeal Swab; Nasopharyngeal(NP) swabs in vial transport medium  Result Value Ref Range Status   SARS Coronavirus 2 by RT PCR NEGATIVE NEGATIVE Final    Comment: (NOTE) SARS-CoV-2 target nucleic acids are NOT DETECTED.  The SARS-CoV-2 RNA is generally detectable in upper respiratory specimens during the acute phase of infection. The lowest concentration of SARS-CoV-2 viral copies this assay can detect is 138 copies/mL. A negative result does not preclude SARS-Cov-2 infection and should not be used as the sole basis for treatment or other patient management decisions. A negative result may occur with  improper specimen collection/handling, submission of specimen other than nasopharyngeal swab, presence of viral mutation(s) within the areas targeted by this assay, and inadequate number of  viral copies(<138 copies/mL). A negative result must be combined with clinical observations, patient history, and epidemiological information. The expected result is Negative.  Fact Sheet for Patients:  EntrepreneurPulse.com.au  Fact Sheet for Healthcare Providers:  IncredibleEmployment.be  This test is no t yet approved or cleared by the [place] FDA and  has been authorized for detection and/or diagnosis of SARS-CoV-2 by FDA under an Emergency Use Authorization (EUA). This EUA will remain  in effect (meaning this test can be used) for the duration of the COVID-19 declaration under Section 564(b)(1) of the Act, 21 U.S.C.section 360bbb-3(b)(1), unless the authorization is terminated  or revoked sooner.  Influenza A by PCR NEGATIVE NEGATIVE Final   Influenza B by PCR NEGATIVE NEGATIVE Final    Comment: (NOTE) The Xpert Xpress SARS-CoV-2/FLU/RSV plus assay is intended as an aid in the diagnosis of influenza from Nasopharyngeal swab specimens and should not be used as a sole basis for treatment. Nasal washings and aspirates are unacceptable for Xpert Xpress SARS-CoV-2/FLU/RSV testing.  Fact Sheet for Patients: EntrepreneurPulse.com.au  Fact Sheet for Healthcare Providers: IncredibleEmployment.be  This test is not yet approved or cleared by the [place] FDA and has been authorized for detection and/or diagnosis of SARS-CoV-2 by FDA under an Emergency Use Authorization (EUA). This EUA will remain in effect (meaning this test can be used) for the duration of the COVID-19 declaration under Section 564(b)(1) of the Act, 21 U.S.C. section 360bbb-3(b)(1), unless the authorization is terminated or revoked.  Performed at [facility], [place] [address] [place], [place] [address]   MRSA Next Gen by PCR, Nasal     Status: None   Collection Time: 06/04/21  1:38 AM   Specimen: Nasal  Mucosa; Nasal Swab  Result Value Ref Range Status   MRSA by PCR Next Gen NOT DETECTED NOT DETECTED Final    Comment: (NOTE) The GeneXpert MRSA Assay (FDA approved for NASAL specimens only), is one component of a comprehensive MRSA colonization surveillance program. It is not intended to diagnose MRSA infection nor to guide or monitor treatment for MRSA infections. Test performance is not FDA approved in patients less than 34 years old. Performed at [facility], [place] [address], [place], [place] [address]   Resp Panel by RT-PCR (Flu A&B, Covid) Nasopharyngeal Swab     Status: None   Collection Time: 06/12/21  6:36 AM   Specimen: Nasopharyngeal Swab; Nasopharyngeal(NP) swabs in vial transport medium  Result Value Ref Range Status   SARS Coronavirus 2 by RT PCR NEGATIVE NEGATIVE Final    Comment: (NOTE) SARS-CoV-2 target nucleic acids are NOT DETECTED.  The SARS-CoV-2 RNA is generally detectable in upper respiratory specimens during the acute phase of infection. The lowest concentration of SARS-CoV-2 viral copies this assay can detect is 138 copies/mL. A negative result does not preclude SARS-Cov-2 infection and should not be used as the sole basis for treatment or other patient management decisions. A negative result may occur with  improper specimen collection/handling, submission of specimen other than nasopharyngeal swab, presence of viral mutation(s) within the areas targeted by this assay, and inadequate number of viral copies(<138 copies/mL). A negative result must be combined with clinical observations, patient history, and epidemiological information. The expected result is Negative.  Fact Sheet for Patients:  EntrepreneurPulse.com.au  Fact Sheet for Healthcare Providers:  IncredibleEmployment.be  This test is no t yet approved or cleared by the [place] FDA and  has been authorized for detection and/or diagnosis  of SARS-CoV-2 by FDA under an Emergency Use Authorization (EUA). This EUA will remain  in effect (meaning this test can be used) for the duration of the COVID-19 declaration under Section 564(b)(1) of the Act, 21 U.S.C.section 360bbb-3(b)(1), unless the authorization is terminated  or revoked sooner.       Influenza A by PCR NEGATIVE NEGATIVE Final   Influenza B by PCR NEGATIVE NEGATIVE Final    Comment: (NOTE) The Xpert Xpress SARS-CoV-2/FLU/RSV plus assay is intended as an aid in the diagnosis of influenza from Nasopharyngeal swab specimens and should not be used as a sole basis for treatment. Nasal washings and aspirates are unacceptable  for Xpert Xpress SARS-CoV-2/FLU/RSV testing.  Fact Sheet for Patients: EntrepreneurPulse.com.au  Fact Sheet for Healthcare Providers: IncredibleEmployment.be  This test is not yet approved or cleared by the [place] FDA and has been authorized for detection and/or diagnosis of SARS-CoV-2 by FDA under an Emergency Use Authorization (EUA). This EUA will remain in effect (meaning this test can be used) for the duration of the COVID-19 declaration under Section 564(b)(1) of the Act, 21 U.S.C. section 360bbb-3(b)(1), unless the authorization is terminated or revoked.  Performed at [facility], [place] [address] [place], [place] [address]      Radiological Exams on Admission: CT ABDOMEN PELVIS WO CONTRAST  Result Date: 06/12/2021 CLINICAL DATA:  Evaluate for acute pancreatitis. New diagnosis of diabetes. EXAM: CT ABDOMEN AND PELVIS WITHOUT CONTRAST TECHNIQUE: Multidetector CT imaging of the abdomen and pelvis was performed following the standard protocol without IV contrast. COMPARISON:  06/03/2021 FINDINGS: Lower chest: Subsegmental atelectasis identified within the anterior left lower lobe Hepatobiliary: Diffuse hepatic steatosis identified. No focal liver abnormality. Gallbladder  appears unremarkable. No bile duct dilatation. Pancreas: Interval progression changes secondary to acute pancreatitis. There has been significant interval increase in edema/size of tail of pancreas. Progressive peripancreatic fat stranding and fluid now extends throughout the mesentery and into the left retroperitoneum. Assessment for loculated fluid collection and pancreatic necrosis limited due to lack of IV contrast material. No obvious drainable fluid collections identified at this time. Spleen: Normal in size without focal abnormality. Adrenals/Urinary Tract: Adrenal glands are unremarkable. Kidneys are normal, without renal calculi, focal lesion, or hydronephrosis. Bladder is unremarkable. Stomach/Bowel: No gastric distension. No abnormal bowel wall thickening, inflammation or distension. The appendix is visualized and appears normal. Vascular/Lymphatic: No significant vascular findings are present. No enlarged abdominal or pelvic lymph nodes. Reproductive: Prostate is unremarkable. Other: No abdominal wall hernia or abnormality. Musculoskeletal: No acute or significant osseous findings. IMPRESSION: 1. Interval progression of acute pancreatitis. Marked progressive edema involves the tail of pancreas with significant interval increase in peripancreatic inflammatory fat stranding and fluid. 2. Assessment for loculated fluid collection and or pancreatic necrosis limited by lack of IV contrast material. No drainable fluid collection identified within these limitations. 3. Hepatic steatosis. Electronically Signed   By: Kerby Moors M.D.   On: 06/12/2021 05:23   CT ABDOMEN PELVIS W CONTRAST  Result Date: 06/12/2021 CLINICAL DATA:  Pancreatitis. EXAM: CT ABDOMEN AND PELVIS WITH CONTRAST TECHNIQUE: Multidetector CT imaging of the abdomen and pelvis was performed using the standard protocol following bolus administration of intravenous contrast. CONTRAST:  <See Chart> OMNIPAQUE IOHEXOL 350 MG/ML SOLN COMPARISON:   Earlier today FINDINGS: Lower chest: Subsegmental atelectasis noted in the left lung base. Hepatobiliary: No focal liver abnormality. Hepatic steatosis noted. Gallbladder normal. No bile duct dilatation. Pancreas: Changes of acute pancreatitis identified as described previously. This demonstrates significant progression when compared with the previous exam. There is diminished enhancement of the distal tail of pancreas which may reflect early necrosis. There is extensive peripancreatic inflammatory fat stranding with free fluid extending into the left retroperitoneum. Developing fluid collection along the greater curvature of the stomach is identified measuring 8.4 x 4.0 x 2.4 cm. This does not have a well-defined enhancing rind suggesting immature fluid collection/pseudocyst. Spleen: Normal in size without focal abnormality. Adrenals/Urinary Tract: Normal adrenal glands. No kidney mass or hydronephrosis. Urinary bladder appears normal. Stomach/Bowel: No bowel dilatation, wall thickening or inflammation identified. The appendix is visualized and appears normal. Vascular/Lymphatic: Normal caliber of the abdominal aorta. The portal  vein and portal venous confluence are patent. 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 Increased left upper quadrant collaterals compared with 06/03/2021. Reproductive: Prostate is unremarkable. Other: Free fluid is identified extending along left retroperitoneum. Musculoskeletal: No acute or significant osseous findings. IMPRESSION: 1. Imaging findings compatible with acute pancreatitis. Significantly increased from 06/03/2021. There is diminished enhancement of the distal tail of pancreas which may reflect early necrosis. 2. Developing fluid collection along the greater curvature of the stomach is identified measuring 8.4 x 4.0 x 2.4 cm. This does not have a well-defined enhancing rind suggesting immature fluid collection/pseudocyst. 3. Thrombosis of the splenic vein with increased  left upper quadrant collaterals compared with 06/03/2021. 4. Hepatic steatosis. Electronically Signed   By: Kerby Moors M.D.   On: 06/12/2021 09:01     All images have been reviewed by me personally.  EKG: Independently reviewed.   Assessment/Plan Principal Problem:   Acute pancreatitis Active Problems:   ETOH abuse   Tobacco abuse   Dehydration   Hepatic steatosis   Essential hypertension   T2DM (type 2 diabetes mellitus) (HCC)   Obesity (BMI 30-39.9)     Assessment Plan  #Acute pancreatitis #Dehydration #Diffuse hepatic steatosis identified on CT scan Patient was recently hospitalized for alcoholic pancreatitis on 0/14 and 06/08/2021.  His lipase picked at 1380 and downtrend to 84 before discharge.  Patient denies drinking alcohol since discharge.  He presented with persistent nausea and abdominal pain since discharge and was found to have lipase 129 and worsening pancreatitis on CT abdomen. CT with IV contrast is currently pending.    Gallbladder and bile duct were nonrevealing and CT abdomen  -Admit  -Monitor lipase, BUN, Hct - Aggressive IVF- he received NS 1000 ml bolus last night. Will order NS 1000 bolus x 1 followed by NS 250 ml/h x 6h followed by 131ml/h -pain management  - he has no fever and WBC minimally elevated at 12.3. will trend WBC - CT abdomen with IV contrast pending. Consider GI consult pending CT abdomen.   Addendum CT abdomen with IV contrast  - diminished enhancement of the distal tail of pancreas which may reflect early necrosis. - Developing fluid collection 8.4 x 4.0 x 2.4 cm.  -Thrombosis of the splenic vein with increased left upper quadrant collaterals compared with 06/03/2021. 4. Hepatic steatosis.  - GI consult called and spoke to on call MD over the phone -heparin infusion is on hold pending hematology consult - further management per GI - Patient is notified above findings - per nurse, pt admitted continuous drinking since  discharge. But he denied it to  me.    #Hypertension - continue home ACEI      #Newly diagnosed T2DM with recent hospitalization for DKA #Obesity with a BMI 39.2  -Hemoglobin A1c 12.3 on 06/04/2021 -Glucose every 4 hour while n.p.o. -will hold insulin orders for now pending Glu trending while NPO   #History of tobacco abuse -Patient reports that he quit smoking since 06/03/21.  He declined nicotine patch  #History of alcohol abuse -Patient reports that he used to drink 1/5 liquor with beers, and he quit drinking alcohol since last admission on 06/03/21, he denies recent drinking since discharge -We will place him on CIWA protocol -Continue folic acid, thiamine and multivitamin  #Previous history of marijuana abuse -Patient reports last use was over a year ago -UDS is pending      DVT prophylaxis: Heparin drip  Code Status: Full code Family Communication: None at bedside Consults  called: N/A Admission status: Inpatient  Status is: Inpatient  Remains inpatient appropriate because:Ongoing active pain requiring inpatient pain management  Dispo: The patient is from: Home              Anticipated d/c is to: Home              Patient currently is not medically stable to d/c.   Difficult to place patient No       Time Spent: 65 minutes.  >50% of the time was devoted to discussing the patients care, assessment, plan and disposition with other care givers along with counseling the patient about the risks and benefits of treatment.    Charlann Lange MD [facility]  If 7PM-7AM, please contact night-coverage   06/12/2021, 9:07 AM

## 2021-06-12 NOTE — ED Notes (Signed)
New IV placed per CT request.

## 2021-06-13 DIAGNOSIS — I82890 Acute embolism and thrombosis of other specified veins: Secondary | ICD-10-CM

## 2021-06-13 LAB — BASIC METABOLIC PANEL
Anion gap: 10 (ref 5–15)
BUN: 6 mg/dL (ref 6–20)
CO2: 26 mmol/L (ref 22–32)
Calcium: 8.7 mg/dL — ABNORMAL LOW (ref 8.9–10.3)
Chloride: 102 mmol/L (ref 98–111)
Creatinine, Ser: 0.71 mg/dL (ref 0.61–1.24)
GFR, Estimated: >60 mL/min (ref >60)
Glucose, Bld: 230 mg/dL — ABNORMAL HIGH (ref 70–99)
Potassium: 3.8 mmol/L (ref 3.5–5.1)
Sodium: 138 mmol/L (ref 135–145)

## 2021-06-13 LAB — CBC
HCT: 36.8 % — ABNORMAL LOW (ref 39.0–52.0)
Hemoglobin: 12.4 g/dL — ABNORMAL LOW (ref 13.0–17.0)
MCH: 30.6 pg (ref 26.0–34.0)
MCHC: 33.7 g/dL (ref 30.0–36.0)
MCV: 90.9 fL (ref 80.0–100.0)
Platelets: 526 10*3/uL — ABNORMAL HIGH (ref 150–400)
RBC: 4.05 MIL/uL — ABNORMAL LOW (ref 4.22–5.81)
RDW: 14.7 % (ref 11.5–15.5)
WBC: 9.5 10*3/uL (ref 4.0–10.5)
nRBC: 0 % (ref 0.0–0.2)

## 2021-06-13 LAB — GLUCOSE, CAPILLARY
Glucose-Capillary: 217 mg/dL — ABNORMAL HIGH (ref 70–99)
Glucose-Capillary: 205 mg/dL — ABNORMAL HIGH (ref 70–99)
Glucose-Capillary: 202 mg/dL — ABNORMAL HIGH (ref 70–99)
Glucose-Capillary: 156 mg/dL — ABNORMAL HIGH (ref 70–99)

## 2021-06-13 MED ORDER — SODIUM CHLORIDE 0.9 % IV SOLN: INTRAVENOUS | Status: DC

## 2021-06-13 MED ORDER — INSULIN ASPART 100 UNIT/ML IJ SOLN
0.0000 [IU] | Freq: Three times a day (TID) | INTRAMUSCULAR | Status: DC
  Administered 2021-06-13: 7 [IU] via SUBCUTANEOUS
  Administered 2021-06-13: 4 [IU] via SUBCUTANEOUS
  Administered 2021-06-13 – 2021-06-14 (×3): 7 [IU] via SUBCUTANEOUS
  Administered 2021-06-14 – 2021-06-15 (×2): 11 [IU] via SUBCUTANEOUS
  Administered 2021-06-15 (×2): 7 [IU] via SUBCUTANEOUS
  Administered 2021-06-16: 15 [IU] via SUBCUTANEOUS
  Administered 2021-06-16: 7 [IU] via SUBCUTANEOUS

## 2021-06-13 MED ORDER — INSULIN ASPART 100 UNIT/ML IJ SOLN
0.0000 [IU] | Freq: Every day | INTRAMUSCULAR | Status: DC
  Administered 2021-06-14: 2 [IU] via SUBCUTANEOUS
  Administered 2021-06-15: 4 [IU] via SUBCUTANEOUS

## 2021-06-13 MED ORDER — INSULIN DETEMIR 100 UNIT/ML ~~LOC~~ SOLN
10.0000 [IU] | Freq: Every day | SUBCUTANEOUS | Status: DC
  Administered 2021-06-13 – 2021-06-14 (×2): 10 [IU] via SUBCUTANEOUS
  Filled 2021-06-13 (×3): qty 0.1

## 2021-06-13 MED ORDER — ENOXAPARIN SODIUM 40 MG/0.4ML IJ SOSY
40.0000 mg | PREFILLED_SYRINGE | INTRAMUSCULAR | Status: DC
  Filled 2021-06-13: qty 0.4

## 2021-06-13 NOTE — Progress Notes (Signed)
Patient ID: Edward Ware, male   DOB: 1986-08-25, 35 y.o.   MRN: [id number]  PROGRESS NOTE    Deniz KIONTE BAUMGARDNER  VCB:[id number] DOB: 03-29-1986 DOA: 06/11/2021 PCP: Patient, No Pcp Per (Inactive)   Brief Narrative:  35 y.o. male with medical history significant of recently diagnosed T2DM, history of alcohol abuse and tobacco abuse, previous history of marijuana abuse, recent hospitalization from 06/03/2021-06/08/2021 for DKA/new diagnosis of diabetes mellitus type 2 and acute pancreatitis presented with worsening nausea and abdominal pain.  On presentation, CT of the abdomen without contrast showed interval progression of acute pancreatitis with marked progressive edema involving the tail of pancreas with significant interval increase in peripancreatic inflammatory fat stranding and fluid.  He was started on IV fluids.  He subsequently went CT abdomen with IV contrast which showed possible early necrosis of distal tail of pancreas with developing fluid collection and thrombosis of splenic vein.  GI and oncology were consulted.  Assessment & Plan:   Acute severe pancreatitis with possible necrosis along with splenic vein thrombosis -CT of the abdomen with IV contrast showed possible early necrosis of distal tail of pancreas with developing fluid collection and thrombosis of splenic vein -GI and oncology have been consulted. -Continue IV fluids, antiemetics as needed, clear liquid diet.  Diet advancement as per GI.  Avoid anticoagulation because of increased risk of bleeding as per oncology.  Hypertension -Blood pressure intermittently elevated.  Continue lisinopril  Diabetes mellitus type 2 with hyperglycemia -Continue CBGs with SSI.  Start Levemir.  Patient was recently discharged on 70/30 insulin.  Obesity -Outpatient follow-up  History of tobacco abuse and alcohol abuse -Patient apparently quit smoking since 06/03/2021.  He also has not had any alcohol since his last admission on  06/03/2021. -Continue thiamine, folic acid and multivitamin  Leukocytosis -Resolved  Thrombocytosis -Possibly reactive.  Monitor  DVT prophylaxis: Start Lovenox Code Status: Full Family Communication: None at bedside Disposition Plan: Status is: Inpatient  Remains inpatient appropriate because:Inpatient level of care appropriate due to severity of illness  Dispo: The patient is from: Home              Anticipated d/c is to: Home              Patient currently is not medically stable to d/c.   Difficult to place patient No  Consultants: GI/oncology  Procedures: None  Antimicrobials: None   Subjective: Patient seen and examined at bedside.  Still complains of intermittent abdominal pain but slightly improving.  Intermittently nauseous.  No vomiting this morning.  No overnight fever or worsening shortness of breath reported.  Objective: Vitals:   06/12/21 1722 06/12/21 2020 06/13/21 0057 06/13/21 0344  BP: (!) 150/96 (!) 143/89 (!) 151/82 (!) 142/86  Pulse: 91 91 84 87  Resp: 18 20 18 16   Temp: 98.6 F (37 C) 98.5 F (36.9 C) 98.3 F (36.8 C) 99.5 F (37.5 C)  TempSrc:  Oral Oral Oral  SpO2: 96% 95% 98% 95%  Weight:      Height:        Intake/Output Summary (Last 24 hours) at 06/13/2021 1102 Last data filed at 06/13/2021 0900 Gross per 24 hour  Intake 1404.94 ml  Output --  Net 1404.94 ml   Filed Weights   06/11/21 2331  Weight: 110.2 kg    Examination:  General exam: Appears calm and comfortable.  Currently on room air Respiratory system: Bilateral decreased breath sounds at bases Cardiovascular system: S1 & S2 heard, Rate  controlled Gastrointestinal system: Abdomen is nondistended, soft and tender in the epigastric and periumbilical regions.  Normal bowel sounds heard. Extremities: No cyanosis, clubbing, edema  Central nervous system: Alert and oriented. No focal neurological deficits. Moving extremities Skin: No rashes, lesions or ulcers Psychiatry:  Judgement and insight appear normal. Mood & affect appropriate.     Data Reviewed: I have personally reviewed following labs and imaging studies  CBC: Recent Labs  Lab 06/07/21 0422 06/08/21 0435 06/12/21 0011 06/12/21 0855 06/13/21 0602  WBC 10.8* 10.0 12.2* 10.3 9.5  NEUTROABS  --   --  7.8*  --   --   HGB 12.6* 12.9* 13.8 12.7* 12.4*  HCT 37.7* 38.1* 41.5 38.8* 36.8*  MCV 91.5 90.1 90.6 91.7 90.9  PLT 187 213 538* 533* 526*   Basic Metabolic Panel: Recent Labs  Lab 06/07/21 0422 06/08/21 0435 06/12/21 0011 06/12/21 0855 06/13/21 0602  NA 134* 135 135 133* 138  K 3.8 3.7 3.7 3.7 3.8  CL 100 100 98 97* 102  CO2 24 25 24 26 26   GLUCOSE 218* 199* 270* 253* 230*  BUN 7 8 9 9 6   CREATININE 0.76 0.77 1.07 1.05 0.71  CALCIUM 8.1* 8.3* 9.3 8.6* 8.7*   GFR: Estimated Creatinine Clearance: 151.6 mL/min (by C-G formula based on SCr of 0.71 mg/dL). Liver Function Tests: Recent Labs  Lab 06/07/21 0422 06/08/21 0435 06/12/21 0011 06/12/21 0855  AST 21 33 35 28  ALT 21 26 42 37  ALKPHOS 63 85 122 106  BILITOT 1.4* 1.2 0.9 0.8  PROT 6.0* 6.0* 8.6* 7.9  ALBUMIN 2.6* 2.3* 3.1* 2.6*   Recent Labs  Lab 06/07/21 0422 06/08/21 0435 06/12/21 0011 06/12/21 0855  LIPASE 100* 84* 129* 109*   No results for input(s): AMMONIA in the last 168 hours. Coagulation Profile: Recent Labs  Lab 06/12/21 1107  INR 1.1   Cardiac Enzymes: No results for input(s): CKTOTAL, CKMB, CKMBINDEX, TROPONINI in the last 168 hours. BNP (last 3 results) No results for input(s): PROBNP in the last 8760 hours. HbA1C: No results for input(s): HGBA1C in the last 72 hours. CBG: Recent Labs  Lab 06/12/21 1332 06/12/21 1628 06/12/21 2201 06/13/21 0508 06/13/21 0743  GLUCAP 259* 305* 308* 217* 205*   Lipid Profile: Recent Labs    06/12/21 0854  CHOL 142  HDL 21*  LDLCALC 91  TRIG 06/15/21*  CHOLHDL 6.8   Thyroid Function Tests: No results for input(s): TSH, T4TOTAL, FREET4, T3FREE,  THYROIDAB in the last 72 hours. Anemia Panel: No results for input(s): VITAMINB12, FOLATE, FERRITIN, TIBC, IRON, RETICCTPCT in the last 72 hours. Sepsis Labs: No results for input(s): PROCALCITON, LATICACIDVEN in the last 168 hours.  Recent Results (from the past 240 hour(s))  Resp Panel by RT-PCR (Flu A&B, Covid) Nasopharyngeal Swab     Status: None   Collection Time: 06/03/21  9:05 PM   Specimen: Nasopharyngeal Swab; Nasopharyngeal(NP) swabs in vial transport medium  Result Value Ref Range Status   SARS Coronavirus 2 by RT PCR NEGATIVE NEGATIVE Final    Comment: (NOTE) SARS-CoV-2 target nucleic acids are NOT DETECTED.  The SARS-CoV-2 RNA is generally detectable in upper respiratory specimens during the acute phase of infection. The lowest concentration of SARS-CoV-2 viral copies this assay can detect is 138 copies/mL. A negative result does not preclude SARS-Cov-2 infection and should not be used as the sole basis for treatment or other patient management decisions. A negative result may occur with  improper specimen collection/handling, submission  of specimen other than nasopharyngeal swab, presence of viral mutation(s) within the areas targeted by this assay, and inadequate number of viral copies(<138 copies/mL). A negative result must be combined with clinical observations, patient history, and epidemiological information. The expected result is Negative.  Fact Sheet for Patients:  BloggerCourse.comhttps://www.fda.gov/media/152166/download  Fact Sheet for Healthcare Providers:  SeriousBroker.ithttps://www.fda.gov/media/152162/download  This test is no t yet approved or cleared by the [place] FDA and  has been authorized for detection and/or diagnosis of SARS-CoV-2 by FDA under an Emergency Use Authorization (EUA). This EUA will remain  in effect (meaning this test can be used) for the duration of the COVID-19 declaration under Section 564(b)(1) of the Act, 21 U.S.C.section 360bbb-3(b)(1), unless the  authorization is terminated  or revoked sooner.       Influenza A by PCR NEGATIVE NEGATIVE Final   Influenza B by PCR NEGATIVE NEGATIVE Final    Comment: (NOTE) The Xpert Xpress SARS-CoV-2/FLU/RSV plus assay is intended as an aid in the diagnosis of influenza from Nasopharyngeal swab specimens and should not be used as a sole basis for treatment. Nasal washings and aspirates are unacceptable for Xpert Xpress SARS-CoV-2/FLU/RSV testing.  Fact Sheet for Patients: BloggerCourse.comhttps://www.fda.gov/media/152166/download  Fact Sheet for Healthcare Providers: SeriousBroker.ithttps://www.fda.gov/media/152162/download  This test is not yet approved or cleared by the [place] FDA and has been authorized for detection and/or diagnosis of SARS-CoV-2 by FDA under an Emergency Use Authorization (EUA). This EUA will remain in effect (meaning this test can be used) for the duration of the COVID-19 declaration under Section 564(b)(1) of the Act, 21 U.S.C. section 360bbb-3(b)(1), unless the authorization is terminated or revoked.  Performed at [facility], [address] [place], [place] [address]   MRSA Next Gen by PCR, Nasal     Status: None   Collection Time: 06/04/21  1:38 AM   Specimen: Nasal Mucosa; Nasal Swab  Result Value Ref Range Status   MRSA by PCR Next Gen NOT DETECTED NOT DETECTED Final    Comment: (NOTE) The GeneXpert MRSA Assay (FDA approved for NASAL specimens only), is one component of a comprehensive MRSA colonization surveillance program. It is not intended to diagnose MRSA infection nor to guide or monitor treatment for MRSA infections. Test performance is not FDA approved in patients less than [address] years old. Performed at [facility], [address] [place], [place] [address]   Resp Panel by RT-PCR (Flu A&B, Covid) Nasopharyngeal Swab     Status: None   Collection Time: 06/12/21  6:36 AM   Specimen: Nasopharyngeal Swab; Nasopharyngeal(NP) swabs in  vial transport medium  Result Value Ref Range Status   SARS Coronavirus 2 by RT PCR NEGATIVE NEGATIVE Final    Comment: (NOTE) SARS-CoV-2 target nucleic acids are NOT DETECTED.  The SARS-CoV-2 RNA is generally detectable in upper respiratory specimens during the acute phase of infection. The lowest concentration of SARS-CoV-2 viral copies this assay can detect is 138 copies/mL. A negative result does not preclude SARS-Cov-2 infection and should not be used as the sole basis for treatment or other patient management decisions. A negative result may occur with  improper specimen collection/handling, submission of specimen other than nasopharyngeal swab, presence of viral mutation(s) within the areas targeted by this assay, and inadequate number of viral copies(<138 copies/mL). A negative result must be combined with clinical observations, patient history, and epidemiological information. The expected result is Negative.  Fact Sheet for Patients:  BloggerCourse.comhttps://www.fda.gov/media/152166/download  Fact Sheet for Healthcare Providers:  SeriousBroker.ithttps://www.fda.gov/media/152162/download  This test is no t yet approved or cleared by the [place] and  has been authorized for detection and/or diagnosis of SARS-CoV-2 by FDA under an Emergency Use Authorization (EUA). This EUA will remain  in effect (meaning this test can be used) for the duration of the COVID-19 declaration under Section 564(b)(1) of the Act, 21 U.S.C.section 360bbb-3(b)(1), unless the authorization is terminated  or revoked sooner.       Influenza A by PCR NEGATIVE NEGATIVE Final   Influenza B by PCR NEGATIVE NEGATIVE Final    Comment: (NOTE) The Xpert Xpress SARS-CoV-2/FLU/RSV plus assay is intended as an aid in the diagnosis of influenza from Nasopharyngeal swab specimens and should not be used as a sole basis for treatment. Nasal washings and aspirates are unacceptable for Xpert Xpress SARS-CoV-2/FLU/RSV testing.  Fact  Sheet for Patients: BloggerCourse.com  Fact Sheet for Healthcare Providers: SeriousBroker.it  This test is not yet approved or cleared by the [place] FDA and has been authorized for detection and/or diagnosis of SARS-CoV-2 by FDA under an Emergency Use Authorization (EUA). This EUA will remain in effect (meaning this test can be used) for the duration of the COVID-19 declaration under Section 564(b)(1) of the Act, 21 U.S.C. section 360bbb-3(b)(1), unless the authorization is terminated or revoked.  Performed at [facility], [address], [place], [place] [address]          Radiology Studies: CT ABDOMEN PELVIS WO CONTRAST  Result Date: 06/12/2021 CLINICAL DATA:  Evaluate for acute pancreatitis. New diagnosis of diabetes. EXAM: CT ABDOMEN AND PELVIS WITHOUT CONTRAST TECHNIQUE: Multidetector CT imaging of the abdomen and pelvis was performed following the standard protocol without IV contrast. COMPARISON:  06/03/2021 FINDINGS: Lower chest: Subsegmental atelectasis identified within the anterior left lower lobe Hepatobiliary: Diffuse hepatic steatosis identified. No focal liver abnormality. Gallbladder appears unremarkable. No bile duct dilatation. Pancreas: Interval progression changes secondary to acute pancreatitis. There has been significant interval increase in edema/size of tail of pancreas. Progressive peripancreatic fat stranding and fluid now extends throughout the mesentery and into the left retroperitoneum. Assessment for loculated fluid collection and pancreatic necrosis limited due to lack of IV contrast material. No obvious drainable fluid collections identified at this time. Spleen: Normal in size without focal abnormality. Adrenals/Urinary Tract: Adrenal glands are unremarkable. Kidneys are normal, without renal calculi, focal lesion, or hydronephrosis. Bladder is unremarkable. Stomach/Bowel: No gastric  distension. No abnormal bowel wall thickening, inflammation or distension. The appendix is visualized and appears normal. Vascular/Lymphatic: No significant vascular findings are present. No enlarged abdominal or pelvic lymph nodes. Reproductive: Prostate is unremarkable. Other: No abdominal wall hernia or abnormality. Musculoskeletal: No acute or significant osseous findings. IMPRESSION: 1. Interval progression of acute pancreatitis. Marked progressive edema involves the tail of pancreas with significant interval increase in peripancreatic inflammatory fat stranding and fluid. 2. Assessment for loculated fluid collection and or pancreatic necrosis limited by lack of IV contrast material. No drainable fluid collection identified within these limitations. 3. Hepatic steatosis. Electronically Signed   By: Signa Kell M.D.   On: 06/12/2021 05:23   CT ABDOMEN PELVIS W CONTRAST  Result Date: 06/12/2021 CLINICAL DATA:  Pancreatitis. EXAM: CT ABDOMEN AND PELVIS WITH CONTRAST TECHNIQUE: Multidetector CT imaging of the abdomen and pelvis was performed using the standard protocol following bolus administration of intravenous contrast. CONTRAST:  <See Chart> OMNIPAQUE IOHEXOL 350 MG/ML SOLN COMPARISON:  Earlier today FINDINGS: Lower chest: Subsegmental atelectasis noted in the left lung base. Hepatobiliary: No focal  liver abnormality. Hepatic steatosis noted. Gallbladder normal. No bile duct dilatation. Pancreas: Changes of acute pancreatitis identified as described previously. This demonstrates significant progression when compared with the previous exam. There is diminished enhancement of the distal tail of pancreas which may reflect early necrosis. There is extensive peripancreatic inflammatory fat stranding with free fluid extending into the left retroperitoneum. Developing fluid collection along the greater curvature of the stomach is identified measuring 8.4 x 4.0 x 2.4 cm. This does not have a well-defined  enhancing rind suggesting immature fluid collection/pseudocyst. Spleen: Normal in size without focal abnormality. Adrenals/Urinary Tract: Normal adrenal glands. No kidney mass or hydronephrosis. Urinary bladder appears normal. Stomach/Bowel: No bowel dilatation, wall thickening or inflammation identified. The appendix is visualized and appears normal. Vascular/Lymphatic: Normal caliber of the abdominal aorta. The portal vein and portal venous confluence are patent. 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 Increased left upper quadrant collaterals compared with 06/03/2021. Reproductive: Prostate is unremarkable. Other: Free fluid is identified extending along left retroperitoneum. Musculoskeletal: No acute or significant osseous findings. IMPRESSION: 1. Imaging findings compatible with acute pancreatitis. Significantly increased from 06/03/2021. There is diminished enhancement of the distal tail of pancreas which may reflect early necrosis. 2. Developing fluid collection along the greater curvature of the stomach is identified measuring 8.4 x 4.0 x 2.4 cm. This does not have a well-defined enhancing rind suggesting immature fluid collection/pseudocyst. 3. Thrombosis of the splenic vein with increased left upper quadrant collaterals compared with 06/03/2021. 4. Hepatic steatosis. Electronically Signed   By: Signa Kell M.D.   On: 06/12/2021 09:01        Scheduled Meds:  folic acid  1 mg Oral Daily   insulin aspart  0-20 Units Subcutaneous TID WC   insulin aspart  0-5 Units Subcutaneous QHS   insulin detemir  10 Units Subcutaneous Daily   lisinopril  10 mg Oral Daily   multivitamin with minerals  1 tablet Oral Daily   thiamine  100 mg Oral Daily   Or   thiamine  100 mg Intravenous Daily   Continuous Infusions:  sodium chloride 125 mL/hr at 06/13/21 0935          Glade Lloyd, MD [facility] 06/13/2021, 11:02 AM

## 2021-06-13 NOTE — Progress Notes (Signed)
[facility] Gastroenterology Progress Note  Edward Ware 35 y.o. 06/03/1986   Subjective: Abd pain persisting. Tolerating clears. Denies nausea.  Objective: Vital signs: Vitals:   06/13/21 0057 06/13/21 0344  BP: (!) 151/82 (!) 142/86  Pulse: 84 87  Resp: 18 16  Temp: 98.3 F (36.8 C) 99.5 F (37.5 C)  SpO2: 98% 95%    Physical Exam: Gen: lethargic, no acute distress  HEENT: anicteric sclera CV: RRR Chest: CTA B Abd: epigastric tenderness with guarding, LUQ tenderness with minimal guarding, otherwise nontender, soft, nondistended, +BS Ext: no edema  Lab Results: Recent Labs    06/12/21 0855 06/13/21 0602  NA 133* 138  K 3.7 3.8  CL 97* 102  CO2 26 26  GLUCOSE 253* 230*  BUN 9 6  CREATININE 1.05 0.71  CALCIUM 8.6* 8.7*   Recent Labs    06/12/21 0011 06/12/21 0855  AST 35 28  ALT 42 37  ALKPHOS 122 106  BILITOT 0.9 0.8  PROT 8.6* 7.9  ALBUMIN 3.1* 2.6*   Recent Labs    06/12/21 0011 06/12/21 0855 06/13/21 0602  WBC 12.2* 10.3 9.5  NEUTROABS 7.8*  --   --   HGB 13.8 12.7* 12.4*  HCT 41.5 38.8* 36.8*  MCV 90.6 91.7 90.9  PLT 538* 533* 526*      Assessment/Plan: Acute pancreatitis likely due to alcohol - question of early necrosis in tail and immature pseudocyst adjacent to greater curvature of stomach. 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 Stable. States that he is stopping drinking. Continue clear liquid diet today and consider slowly advancing tomorrow depending on his clinical condition. Supportive care. Dr. Nicholes Mango will f/u tomorrow.   [name] 06/13/2021, 11:54 AM  Questions please call [phone] Patient ID: Edward Ware, male   DOB: [birth year], 34 y.o.   MRN: [id number]

## 2021-06-13 NOTE — Plan of Care (Signed)
Pt is injury free, afebrile, alert, and oriented. VS within baseline. Pt remains to complain of abdominal pain and he is getting pain medication as scheduled. He denies chest pain, SOB, or acute changes during this shift. Will continue to monitor  Problem: Education: Goal: Knowledge of General Education information will improve Description: Including pain rating scale, medication(s)/side effects and non-pharmacologic comfort measures Outcome: Progressing   Problem: Health Behavior/Discharge Planning: Goal: Ability to manage health-related needs will improve Outcome: Progressing   Problem: Clinical Measurements: Goal: Ability to maintain clinical measurements within normal limits will improve Outcome: Progressing Goal: Will remain free from infection Outcome: Progressing Goal: Diagnostic test results will improve Outcome: Progressing Goal: Respiratory complications will improve Outcome: Progressing Goal: Cardiovascular complication will be avoided Outcome: Progressing

## 2021-06-13 NOTE — Consult Note (Signed)
[facility] CONSULT NOTE  Patient Care Team: Patient, No Pcp Per (Inactive) as PCP - General (General Practice)  CHIEF COMPLAINTS/PURPOSE OF CONSULTATION:  Splenic vein thrombosis  HISTORY OF PRESENTING ILLNESS:  Edward Ware 35 y.o. male is here because of recent diagnosis of acute pancreatitis.  He was admitted recently with acute pancreatitis from alcohol abuse.  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  We are consulted to assess the role for anticoagulation in the situation.  MEDICAL HISTORY:  Past Medical History:  Diagnosis Date   Diabetes mellitus without complication (HCC)    Hypertension     SURGICAL HISTORY: History reviewed. No pertinent surgical history.  SOCIAL HISTORY: Social History   Socioeconomic History   Marital status: Single    Spouse name: Not on file   Number of children: Not on file   Years of education: Not on file   Highest education level: Not on file  Occupational History   Not on file  Tobacco Use   Smoking status: Former   Smokeless tobacco: Never  Substance and Sexual Activity   Alcohol use: Yes    Comment: 1 pint a night of alcohol   Drug use: No   Sexual activity: Yes  Other Topics Concern   Not on file  Social History Narrative   Not on file   Social Determinants of Health   Financial Resource Strain: Not on file  Food Insecurity: Not on file  Transportation Needs: Not on file  Physical Activity: Not on file  Stress: Not on file  Social Connections: Not on file  Intimate Partner Violence: Not on file    FAMILY HISTORY: Family History  Problem Relation Age of Onset   Diabetes Mother    Hypertension Mother    Diabetes Other     ALLERGIES:  has No Known Allergies.  MEDICATIONS:  Current Facility-Administered Medications  Medication Dose Route  Frequency Provider Last Rate Last Admin   0.9 %  sodium chloride infusion   Intravenous Continuous Glade Lloyd, MD 125 mL/hr at 06/13/21 0935 New Bag at 06/13/21 0935   acetaminophen (TYLENOL) tablet 650 mg  650 mg Oral Q6H PRN Dede Query, MD       Or   acetaminophen (TYLENOL) suppository 650 mg  650 mg Rectal Q6H PRN Dierdre Searles, Na, MD       folic acid (FOLVITE) tablet 1 mg  1 mg Oral Daily Li, Na, MD   1 mg at 06/13/21 0935   HYDROcodone-acetaminophen (NORCO/VICODIN) 5-325 MG per tablet 1-2 tablet  1-2 tablet Oral Q6H PRN Dierdre Searles, Na, MD   2 tablet at 06/13/21 0405   HYDROmorphone (DILAUDID) injection 1 mg  1 mg Intravenous Q3H PRN Dierdre Searles, Na, MD   1 mg at 06/13/21 0859   insulin aspart (novoLOG) injection 0-20 Units  0-20 Units Subcutaneous TID WC Alekh, Kshitiz, MD   7 Units at 06/13/21 0900   insulin aspart (novoLOG) injection 0-5 Units  0-5 Units Subcutaneous QHS Alekh, Kshitiz, MD       insulin detemir (LEVEMIR) injection 10 Units  10 Units Subcutaneous Daily Glade Lloyd, MD   10 Units at 06/13/21 0936   lisinopril (ZESTRIL) tablet 10 mg  10 mg Oral Daily Li, Na, MD   10 mg at 06/13/21 0937   LORazepam (ATIVAN) tablet 1-4 mg  1-4 mg Oral Q1H  PRN Dede Query, MD       Or   LORazepam (ATIVAN) injection 1-4 mg  1-4 mg Intravenous Q1H PRN Dierdre Searles, Na, MD       multivitamin with minerals tablet 1 tablet  1 tablet Oral Daily Dierdre Searles, Na, MD   1 tablet at 06/13/21 0937   ondansetron (ZOFRAN) tablet 4 mg  4 mg Oral Q6H PRN Dede Query, MD       Or   ondansetron (ZOFRAN) injection 4 mg  4 mg Intravenous Q6H PRN Dierdre Searles, Na, MD       oxyCODONE (Oxy IR/ROXICODONE) immediate release tablet 5 mg  5 mg Oral Q6H PRN Dierdre Searles, Na, MD   5 mg at 06/13/21 2229   thiamine tablet 100 mg  100 mg Oral Daily Li, Na, MD   100 mg at 06/13/21 7989   Or   thiamine (B-1) injection 100 mg  100 mg Intravenous Daily Li, Na, MD       zolpidem (AMBIEN) tablet 5 mg  5 mg Oral QHS PRN,MR X 1 Li, Na, MD   5 mg at 06/12/21 2230    REVIEW OF SYSTEMS:   Abdominal  pain is improving significantly.  All other systems were reviewed with the patient and are negative.  PHYSICAL EXAMINATION: ECOG PERFORMANCE STATUS: 2 - Symptomatic, <50% confined to bed  Vitals:   06/13/21 0057 06/13/21 0344  BP: (!) 151/82 (!) 142/86  Pulse: 84 87  Resp: 18 16  Temp: 98.3 F (36.8 C) 99.5 F (37.5 C)  SpO2: 98% 95%   Filed Weights   06/11/21 2331  Weight: 243 lb (110.2 kg)    Abdominal tenderness  LABORATORY DATA:  I have reviewed the data as listed Lab Results  Component Value Date   WBC 9.5 06/13/2021   HGB 12.4 (L) 06/13/2021   HCT 36.8 (L) 06/13/2021   MCV 90.9 06/13/2021   PLT 526 (H) 06/13/2021   Lab Results  Component Value Date   NA 138 06/13/2021   K 3.8 06/13/2021   CL 102 06/13/2021   CO2 26 06/13/2021     ASSESSMENT AND PLAN:  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 Dulce Sellar that the presence of collaterals indicate that this is a chronic obstruction and therefore does not warrant an immediate anticoagulation. The guidelines for splenic vein thrombosis recommend to weigh the risks and benefits of anticoagulation. Patient is at high risk for bleeding and therefore my recommendation is to hold off on anticoagulation.  We discussed extensively about cessation of alcohol as an absolute essential for his survival.  He appears to understand the significance of that.  We are available if there are any further questions.  All questions were answered. The patient knows to call the clinic with any problems, questions or concerns.    Tamsen Meek, MD @T @

## 2021-06-13 NOTE — Progress Notes (Signed)
The hospitalist on coverage was notified that pt has elevated BG and has no insulin coverage orders. One time order of 6 units of insulin was administered. Will address insulin orders with the attending MD in the morning

## 2021-06-13 NOTE — Progress Notes (Signed)
Pt is agitated that he had to wait for 40 mins for pain medicine thinking his PRN meds is scheduled at a set time.  Attempted to explain the way meds are ordered. Pt is on the phone talking to family complaining about lack of care. Explained to pt that I was reviewing his chart and will address his pain concerns if he drops the attitude

## 2021-06-14 ENCOUNTER — Ambulatory Visit: Payer: Self-pay | Admitting: Nurse Practitioner

## 2021-06-14 LAB — CBC WITH DIFFERENTIAL/PLATELET
Abs Immature Granulocytes: 0.21 10*3/uL — ABNORMAL HIGH (ref 0.00–0.07)
Basophils Absolute: 0.1 10*3/uL (ref 0.0–0.1)
Basophils Relative: 1 %
Eosinophils Absolute: 0.2 10*3/uL (ref 0.0–0.5)
Eosinophils Relative: 2 %
HCT: 40.3 % (ref 39.0–52.0)
Hemoglobin: 13.2 g/dL (ref 13.0–17.0)
Immature Granulocytes: 2 %
Lymphocytes Relative: 14 %
Lymphs Abs: 1.3 10*3/uL (ref 0.7–4.0)
MCH: 30.1 pg (ref 26.0–34.0)
MCHC: 32.8 g/dL (ref 30.0–36.0)
MCV: 92 fL (ref 80.0–100.0)
Monocytes Absolute: 0.7 10*3/uL (ref 0.1–1.0)
Monocytes Relative: 7 %
Neutro Abs: 6.8 10*3/uL (ref 1.7–7.7)
Neutrophils Relative %: 74 %
Platelets: 588 10*3/uL — ABNORMAL HIGH (ref 150–400)
RBC: 4.38 MIL/uL (ref 4.22–5.81)
RDW: 14.6 % (ref 11.5–15.5)
WBC: 9.1 10*3/uL (ref 4.0–10.5)
nRBC: 0 % (ref 0.0–0.2)

## 2021-06-14 LAB — COMPREHENSIVE METABOLIC PANEL
ALT: 43 U/L (ref 0–44)
AST: 42 U/L — ABNORMAL HIGH (ref 15–41)
Albumin: 2.8 g/dL — ABNORMAL LOW (ref 3.5–5.0)
Alkaline Phosphatase: 118 U/L (ref 38–126)
Anion gap: 11 (ref 5–15)
BUN: 6 mg/dL (ref 6–20)
CO2: 26 mmol/L (ref 22–32)
Calcium: 8.7 mg/dL — ABNORMAL LOW (ref 8.9–10.3)
Chloride: 95 mmol/L — ABNORMAL LOW (ref 98–111)
Creatinine, Ser: 0.79 mg/dL (ref 0.61–1.24)
GFR, Estimated: >60 mL/min (ref >60)
Glucose, Bld: 233 mg/dL — ABNORMAL HIGH (ref 70–99)
Potassium: 3.9 mmol/L (ref 3.5–5.1)
Sodium: 132 mmol/L — ABNORMAL LOW (ref 135–145)
Total Bilirubin: 0.6 mg/dL (ref 0.3–1.2)
Total Protein: 8.1 g/dL (ref 6.5–8.1)

## 2021-06-14 LAB — GLUCOSE, CAPILLARY
Glucose-Capillary: 224 mg/dL — ABNORMAL HIGH (ref 70–99)
Glucose-Capillary: 236 mg/dL — ABNORMAL HIGH (ref 70–99)
Glucose-Capillary: 217 mg/dL — ABNORMAL HIGH (ref 70–99)
Glucose-Capillary: 288 mg/dL — ABNORMAL HIGH (ref 70–99)
Glucose-Capillary: 240 mg/dL — ABNORMAL HIGH (ref 70–99)

## 2021-06-14 LAB — MAGNESIUM: Magnesium: 2.2 mg/dL (ref 1.7–2.4)

## 2021-06-14 MED ORDER — HYDROMORPHONE HCL 1 MG/ML IJ SOLN
1.0000 mg | INTRAMUSCULAR | Status: DC | PRN
  Administered 2021-06-14 – 2021-06-16 (×14): 1 mg via INTRAVENOUS
  Filled 2021-06-14 (×14): qty 1

## 2021-06-14 MED ORDER — OXYCODONE HCL 5 MG PO TABS
5.0000 mg | ORAL_TABLET | ORAL | Status: DC | PRN
  Administered 2021-06-14 (×2): 5 mg via ORAL
  Filled 2021-06-14: qty 1
  Filled 2021-06-14: qty 2
  Filled 2021-06-14: qty 1

## 2021-06-14 NOTE — Progress Notes (Signed)
Mobility Specialist - Progress Note    06/14/21 1211  Mobility  Activity Ambulated in hall  Level of Assistance Independent after set-up  Assistive Device None  Distance Ambulated (ft) 80 ft  Mobility Ambulated independently in hallway  Mobility Response Tolerated well  Mobility performed by Mobility specialist  $Mobility charge 1 Mobility    Pt ambulated 80 ft in hallway using no assistive device and is reported to be independent. No signs of pain, dizziness, or SOB were presented. Pt returned to bed after session and was left with call bell at side. Pt was encouraged to ambulate for remainder of hospitalization.    Arliss Journey Mobility Specialist Acute [facility] Services Phone: [phone] 06/14/21, 12:14 PM

## 2021-06-14 NOTE — Progress Notes (Signed)
UNASSIGNED PATIENT Subjective: Patient continues to complain of abdominal pain requiring medications every 3 hours. He denies having any nausea vomiting.  Is interested in advancing his diet.  CT done on 06/12/2021 revealed acute pancreatitis with diminished enhancement of the distal tail of the pancreas which may reflect early necrosis along with a developing fluid collection of the greater curvature the stomach measuring 8.4 x 4 x 2.4 cm along with thrombosis of splenic vein with increased left upper quadrant collaterals and hepatic steatosis.  Objective: Vital signs in last 24 hours: Temp:  [98 F (36.7 C)-98.7 F (37.1 C)] 98 F (36.7 C) (08/29 0610) Pulse Rate:  [89-93] 90 (08/29 0610) Resp:  [18-20] 20 (08/29 0610) BP: (139-144)/(81-91) 139/81 (08/29 0610) SpO2:  [97 %-100 %] 97 % (08/29 0610)    Intake/Output from previous day: 08/28 0701 - 08/29 0700 In: 1162.1 [P.O.:360; I.V.:802.1] Out: -  Intake/Output this shift: No intake/output data recorded.  General appearance: alert, cooperative, appears older than stated age, and moderately obese Resp: clear to auscultation bilaterally Cardio: regular rate and rhythm, S1, S2 normal, no murmur, click, rub or gallop GI: soft, non-tender; bowel sounds normal; no masses,  no organomegaly  Lab Results: Recent Labs    06/12/21 0011 06/12/21 0855 06/13/21 0602  WBC 12.2* 10.3 9.5  HGB 13.8 12.7* 12.4*  HCT 41.5 38.8* 36.8*  PLT 538* 533* 526*   BMET Recent Labs    06/12/21 0011 06/12/21 0855 06/13/21 0602  NA 135 133* 138  K 3.7 3.7 3.8  CL 98 97* 102  CO2 _0 GLUCOSE 270* 253* 230*  BUN _1 CREATININE 1.07 1.05 0.71  CALCIUM 9.3 8.6* 8.7*   LFT Recent Labs    06/12/21 0855  PROT 7.9  ALBUMIN 2.6*  AST 28  ALT 37  ALKPHOS 106  BILITOT 0.8   PT/INR Recent Labs    06/12/21 1107  LABPROT 14.2  INR 1.1   Studies/Results: CT ABDOMEN PELVIS W CONTRAST  Result Date: 06/12/2021 CLINICAL DATA:   Pancreatitis. EXAM: CT ABDOMEN AND PELVIS WITH CONTRAST TECHNIQUE: Multidetector CT imaging of the abdomen and pelvis was performed using the standard protocol following bolus administration of intravenous contrast. CONTRAST:  <See Chart> OMNIPAQUE IOHEXOL 350 MG/ML SOLN COMPARISON:  Earlier today FINDINGS: Lower chest: Subsegmental atelectasis noted in the left lung base. Hepatobiliary: No focal liver abnormality. Hepatic steatosis noted. Gallbladder normal. No bile duct dilatation. Pancreas: Changes of acute pancreatitis identified as described previously. This demonstrates significant progression when compared with the previous exam. There is diminished enhancement of the distal tail of pancreas which may reflect early necrosis. There is extensive peripancreatic inflammatory fat stranding with free fluid extending into the left retroperitoneum. Developing fluid collection along the greater curvature of the stomach is identified measuring 8.4 x 4.0 x 2.4 cm. This does not have a well-defined enhancing rind suggesting immature fluid collection/pseudocyst. Spleen: Normal in size without focal abnormality. Adrenals/Urinary Tract: Normal adrenal glands. No kidney mass or hydronephrosis. Urinary bladder appears normal. Stomach/Bowel: No bowel dilatation, wall thickening or inflammation identified. The appendix is visualized and appears normal. Vascular/Lymphatic: Normal caliber of the abdominal aorta. The portal vein and portal venous confluence are patent. 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 Increased left upper quadrant collaterals compared with 06/03/2021. Reproductive: Prostate is unremarkable. Other: Free fluid is identified extending along left retroperitoneum. Musculoskeletal: No acute or significant osseous findings. IMPRESSION: 1. Imaging findings compatible with acute pancreatitis. Significantly increased from 06/03/2021. There  is diminished enhancement of the distal tail of pancreas which may  reflect early necrosis. 2. Developing fluid collection along the greater curvature of the stomach is identified measuring 8.4 x 4.0 x 2.4 cm. This does not have a well-defined enhancing rind suggesting immature fluid collection/pseudocyst. 3. Thrombosis of the splenic vein with increased left upper quadrant collaterals compared with 06/03/2021. 4. Hepatic steatosis. Electronically Signed   By: Kerby Moors M.D.   On: 06/12/2021 09:01    Medications: I have reviewed the patient's current medications. Prior to Admission:  Medications Prior to Admission  Medication Sig Dispense Refill Last Dose   Blood Glucose Monitoring Suppl (TRUE METRIX METER) w/Device KIT use up to 4 times daily as directed 1 kit 0    folic acid (FOLVITE) 1 MG tablet Take 1 tablet (1 mg total) by mouth daily. 30 tablet 0 06/11/2021   glucose blood test strip use up to 4 times daily as directed. 100 each 0    insulin aspart protamine - aspart (NOVOLOG 70/30 MIX) (70-30) 100 UNIT/ML FlexPen Inject 15 Units into the skin 2 (two) times daily with a meal. 15 mL 11 06/11/2021 at pm   Insulin Pen Needle 31G X 5 MM MISC use as directed 100 each 0    lisinopril (ZESTRIL) 10 MG tablet Take 1 tablet (10 mg total) by mouth daily. 30 tablet 11 06/11/2021   oxyCODONE (OXY IR/ROXICODONE) 5 MG immediate release tablet Take 1 tablet (5 mg total) by mouth every 6 (six) hours as needed for moderate pain. 20 tablet 0 06/11/2021   TRUEplus Lancets 28G MISC use up to 4 times daily as directed. 100 each 0    atorvastatin (LIPITOR) 20 MG tablet Take 1 tablet (20 mg total) by mouth daily. 30 tablet 11    insulin aspart protamine - aspart (NOVOLOG 70/30 MIX) (70-30) 100 UNIT/ML FlexPen Inject 15 units into the skin 2 times daily 3 mL 0    lisinopril (ZESTRIL) 10 MG tablet Take 1 tablet (10 mg total) by mouth daily. (Patient not taking: Reported on 06/12/2021) 30 tablet 0 Completed Course   metFORMIN (GLUCOPHAGE) 500 MG tablet Take 1 tablet (500 mg total) by  mouth daily with breakfast. 30 tablet 11    metFORMIN (GLUCOPHAGE) 500 MG tablet Take 1 tablet (500 mg total) by mouth daily. (Patient not taking: Reported on 06/12/2021) 30 tablet 0 Completed Course   thiamine 100 MG tablet Take 1 tablet (100 mg total) by mouth daily. 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 2) AODM/HTN. 3) History of tobacco abuse.  LOS: 2 days   Juanita Craver 06/14/2021, 6:57 AM

## 2021-06-14 NOTE — Progress Notes (Signed)
Patient ID: Edward Ware, male   DOB: [birth year][name], 35 y.o.   MRN: [id number]  PROGRESS NOTE    [name]  Edward Ware:[id number] DOB: [birth year] DOA: 06/11/2021 PCP: Patient, No Pcp Per (Inactive)   Brief Narrative:  35 y.o. male with medical history significant of recently diagnosed T2DM, history of alcohol abuse and tobacco abuse, previous history of marijuana abuse, recent hospitalization from 06/03/2021-06/08/2021 for DKA/new diagnosis of diabetes mellitus type 2 and acute pancreatitis presented with worsening nausea and abdominal pain.  On presentation, CT of the abdomen without contrast showed interval progression of acute pancreatitis with marked progressive edema involving the tail of pancreas with significant interval increase in peripancreatic inflammatory fat stranding and fluid.  He was started on IV fluids.  He subsequently went CT abdomen with IV contrast which showed possible early necrosis of distal tail of pancreas with developing fluid collection and thrombosis of splenic vein.  GI and oncology were consulted.  Assessment & Plan:   Acute severe pancreatitis with possible necrosis along with splenic vein thrombosis -CT of the abdomen with IV contrast showed possible early necrosis of distal tail of pancreas with developing fluid collection and thrombosis of splenic vein -GI and oncology have been consulted. -Continue IV fluids, antiemetics as needed, clear liquid diet.  Diet advancement as per GI.  Avoid anticoagulation because of increased risk of bleeding as per oncology. -Patient still requiring intermittent IV Dilaudid  Hypertension -Blood pressure intermittently elevated.  Continue lisinopril  Diabetes mellitus type 2 with hyperglycemia -Continue CBGs with SSI.  Continue Levemir.  Patient was recently discharged on 70/30 insulin.  Obesity -Outpatient follow-up  History of tobacco abuse and alcohol abuse -Patient apparently quit smoking since 06/03/2021.  He also has  not had any alcohol since his last admission on 06/03/2021. -Continue thiamine, folic acid and multivitamin  Leukocytosis -Resolved  Thrombocytosis -Possibly reactive.  Monitor  DVT prophylaxis: Lovenox Code Status: Full Family Communication: None at bedside Disposition Plan: Status is: Inpatient  Remains inpatient appropriate because:Inpatient level of care appropriate due to severity of illness  Dispo: The patient is from: Home              Anticipated d/c is to: Home              Patient currently is not medically stable to d/c.   Difficult to place patient No  Consultants: GI/oncology  Procedures: None  Antimicrobials: None   Subjective: Patient seen and examined at bedside.  No overnight fever, shortness of breath or chest pain reported.  Still complains of severe intermittent abdominal pain requiring IV Dilaudid and oral pain medications.    Objective: Vitals:   06/13/21 0344 06/13/21 1456 06/13/21 1928 06/14/21 0610  BP: (!) 142/86 (!) 144/85 (!) 140/91 139/81  Pulse: 87 93 89 90  Resp: 16 18 20 20   Temp: 99.5 F (37.5 C) 98.7 F (37.1 C) 98.1 F (36.7 C) 98 F (36.7 C)  TempSrc: Oral  Oral Oral  SpO2: 95% 98% 100% 97%  Weight:      Height:        Intake/Output Summary (Last 24 hours) at 06/14/2021 0749 Last data filed at 06/13/2021 1900 Gross per 24 hour  Intake 1162.08 ml  Output --  Net 1162.08 ml    Filed Weights   06/11/21 2331  Weight: 110.2 kg    Examination:  General exam: No distress.  On room air currently. Respiratory system: Decreased breath sounds at bases bilaterally cardiovascular system: Rate controlled,  S1-S2 heard gastrointestinal system: Abdomen is distended, soft and tender in the epigastric and periumbilical regions.  Bowel sounds are heard extremities: Mild lower extremity edema present; no clubbing    Data Reviewed: I have personally reviewed following labs and imaging studies  CBC: Recent Labs  Lab 06/08/21 0435  06/12/21 0011 06/12/21 0855 06/13/21 0602  WBC 10.0 12.2* 10.3 9.5  NEUTROABS  --  7.8*  --   --   HGB 12.9* 13.8 12.7* 12.4*  HCT 38.1* 41.5 38.8* 36.8*  MCV 90.1 90.6 91.7 90.9  PLT 213 538* 533* 526*    Basic Metabolic Panel: Recent Labs  Lab 06/08/21 0435 06/12/21 0011 06/12/21 0855 06/13/21 0602  NA 135 135 133* 138  K 3.7 3.7 3.7 3.8  CL 100 98 97* 102  CO2 25 24 26 26   GLUCOSE 199* 270* 253* 230*  BUN 8 9 9 6   CREATININE 0.77 1.07 1.05 0.71  CALCIUM 8.3* 9.3 8.6* 8.7*    GFR: Estimated Creatinine Clearance: 151.6 mL/min (by C-G formula based on SCr of 0.71 mg/dL). Liver Function Tests: Recent Labs  Lab 06/08/21 0435 06/12/21 0011 06/12/21 0855  AST 33 35 28  ALT 26 42 37  ALKPHOS 85 122 106  BILITOT 1.2 0.9 0.8  PROT 6.0* 8.6* 7.9  ALBUMIN 2.3* 3.1* 2.6*    Recent Labs  Lab 06/08/21 0435 06/12/21 0011 06/12/21 0855  LIPASE 84* 129* 109*    No results for input(s): AMMONIA in the last 168 hours. Coagulation Profile: Recent Labs  Lab 06/12/21 1107  INR 1.1    Cardiac Enzymes: No results for input(s): CKTOTAL, CKMB, CKMBINDEX, TROPONINI in the last 168 hours. BNP (last 3 results) No results for input(s): PROBNP in the last 8760 hours. HbA1C: No results for input(s): HGBA1C in the last 72 hours. CBG: Recent Labs  Lab 06/13/21 0508 06/13/21 0743 06/13/21 1200 06/13/21 1642 06/13/21 2140  GLUCAP 217* 205* 202* 156* 224*    Lipid Profile: Recent Labs    06/12/21 0854  CHOL 142  HDL 21*  LDLCALC 91  TRIG 06/15/21*  CHOLHDL 6.8    Thyroid Function Tests: No results for input(s): TSH, T4TOTAL, FREET4, T3FREE, THYROIDAB in the last 72 hours. Anemia Panel: No results for input(s): VITAMINB12, FOLATE, FERRITIN, TIBC, IRON, RETICCTPCT in the last 72 hours. Sepsis Labs: No results for input(s): PROCALCITON, LATICACIDVEN in the last 168 hours.  Recent Results (from the past 240 hour(s))  Resp Panel by RT-PCR (Flu A&B, Covid)  Nasopharyngeal Swab     Status: None   Collection Time: 06/12/21  6:36 AM   Specimen: Nasopharyngeal Swab; Nasopharyngeal(NP) swabs in vial transport medium  Result Value Ref Range Status   SARS Coronavirus 2 by RT PCR NEGATIVE NEGATIVE Final    Comment: (NOTE) SARS-CoV-2 target nucleic acids are NOT DETECTED.  The SARS-CoV-2 RNA is generally detectable in upper respiratory specimens during the acute phase of infection. The lowest concentration of SARS-CoV-2 viral copies this assay can detect is 138 copies/mL. A negative result does not preclude SARS-Cov-2 infection and should not be used as the sole basis for treatment or other patient management decisions. A negative result may occur with  improper specimen collection/handling, submission of specimen other than nasopharyngeal swab, presence of viral mutation(s) within the areas targeted by this assay, and inadequate number of viral copies(<138 copies/mL). A negative result must be combined with clinical observations, patient history, and epidemiological information. The expected result is Negative.  Fact Sheet for Patients:  568  Fact Sheet for Healthcare Providers:  SeriousBroker.it  This test is no t yet approved or cleared by the [place] FDA and  has been authorized for detection and/or diagnosis of SARS-CoV-2 by FDA under an Emergency Use Authorization (EUA). This EUA will remain  in effect (meaning this test can be used) for the duration of the COVID-19 declaration under Section 564(b)(1) of the Act, 21 U.S.C.section 360bbb-3(b)(1), unless the authorization is terminated  or revoked sooner.       Influenza A by PCR NEGATIVE NEGATIVE Final   Influenza B by PCR NEGATIVE NEGATIVE Final    Comment: (NOTE) The Xpert Xpress SARS-CoV-2/FLU/RSV plus assay is intended as an aid in the diagnosis of influenza from Nasopharyngeal swab specimens and should not be  used as a sole basis for treatment. Nasal washings and aspirates are unacceptable for Xpert Xpress SARS-CoV-2/FLU/RSV testing.  Fact Sheet for Patients: BloggerCourse.com  Fact Sheet for Healthcare Providers: SeriousBroker.it  This test is not yet approved or cleared by the [place] FDA and has been authorized for detection and/or diagnosis of SARS-CoV-2 by FDA under an Emergency Use Authorization (EUA). This EUA will remain in effect (meaning this test can be used) for the duration of the COVID-19 declaration under Section 564(b)(1) of the Act, 21 U.S.C. section 360bbb-3(b)(1), unless the authorization is terminated or revoked.  Performed at [facility], [address] [place], [place] [address]           Radiology Studies: CT ABDOMEN PELVIS W CONTRAST  Result Date: 06/12/2021 CLINICAL DATA:  Pancreatitis. EXAM: CT ABDOMEN AND PELVIS WITH CONTRAST TECHNIQUE: Multidetector CT imaging of the abdomen and pelvis was performed using the standard protocol following bolus administration of intravenous contrast. CONTRAST:  <See Chart> OMNIPAQUE IOHEXOL 350 MG/ML SOLN COMPARISON:  Earlier today FINDINGS: Lower chest: Subsegmental atelectasis noted in the left lung base. Hepatobiliary: No focal liver abnormality. Hepatic steatosis noted. Gallbladder normal. No bile duct dilatation. Pancreas: Changes of acute pancreatitis identified as described previously. This demonstrates significant progression when compared with the previous exam. There is diminished enhancement of the distal tail of pancreas which may reflect early necrosis. There is extensive peripancreatic inflammatory fat stranding with free fluid extending into the left retroperitoneum. Developing fluid collection along the greater curvature of the stomach is identified measuring 8.4 x 4.0 x 2.4 cm. This does not have a well-defined enhancing rind suggesting  immature fluid collection/pseudocyst. Spleen: Normal in size without focal abnormality. Adrenals/Urinary Tract: Normal adrenal glands. No kidney mass or hydronephrosis. Urinary bladder appears normal. Stomach/Bowel: No bowel dilatation, wall thickening or inflammation identified. The appendix is visualized and appears normal. Vascular/Lymphatic: Normal caliber of the abdominal aorta. The portal vein and portal venous confluence are patent. 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 Increased left upper quadrant collaterals compared with 06/03/2021. Reproductive: Prostate is unremarkable. Other: Free fluid is identified extending along left retroperitoneum. Musculoskeletal: No acute or significant osseous findings. IMPRESSION: 1. Imaging findings compatible with acute pancreatitis. Significantly increased from 06/03/2021. There is diminished enhancement of the distal tail of pancreas which may reflect early necrosis. 2. Developing fluid collection along the greater curvature of the stomach is identified measuring 8.4 x 4.0 x 2.4 cm. This does not have a well-defined enhancing rind suggesting immature fluid collection/pseudocyst. 3. Thrombosis of the splenic vein with increased left upper quadrant collaterals compared with 06/03/2021. 4. Hepatic steatosis. Electronically Signed   By: Signa Kell M.D.   On: 06/12/2021 09:01  Scheduled Meds:  enoxaparin (LOVENOX) injection  40 mg Subcutaneous Q24H   folic acid  1 mg Oral Daily   insulin aspart  0-20 Units Subcutaneous TID WC   insulin aspart  0-5 Units Subcutaneous QHS   insulin detemir  10 Units Subcutaneous Daily   lisinopril  10 mg Oral Daily   multivitamin with minerals  1 tablet Oral Daily   thiamine  100 mg Oral Daily   Or   thiamine  100 mg Intravenous Daily   Continuous Infusions:  sodium chloride 125 mL/hr at 06/14/21 0522          Glade Lloyd, MD [facility] 06/14/2021, 7:49 AM

## 2021-06-15 LAB — GLUCOSE, CAPILLARY
Glucose-Capillary: 206 mg/dL — ABNORMAL HIGH (ref 70–99)
Glucose-Capillary: 239 mg/dL — ABNORMAL HIGH (ref 70–99)
Glucose-Capillary: 253 mg/dL — ABNORMAL HIGH (ref 70–99)
Glucose-Capillary: 319 mg/dL — ABNORMAL HIGH (ref 70–99)

## 2021-06-15 MED ORDER — INSULIN DETEMIR 100 UNIT/ML ~~LOC~~ SOLN
15.0000 [IU] | Freq: Every day | SUBCUTANEOUS | Status: DC
  Administered 2021-06-15 – 2021-06-16 (×2): 15 [IU] via SUBCUTANEOUS
  Filled 2021-06-15 (×3): qty 0.15

## 2021-06-15 NOTE — Progress Notes (Signed)
Patient ID: Edward Ware, male   DOB: 05-18-1986, 35 y.o.   MRN: [id number]  PROGRESS NOTE    Edward Ware  IFO:[id number] DOB: Aug 31, 1986 DOA: 06/11/2021 PCP: Patient, No Pcp Per (Inactive)   Brief Narrative:  35 y.o. male with medical history significant of recently diagnosed T2DM, history of alcohol abuse and tobacco abuse, previous history of marijuana abuse, recent hospitalization from 06/03/2021-06/08/2021 for DKA/new diagnosis of diabetes mellitus type 2 and acute pancreatitis presented with worsening nausea and abdominal pain.  On presentation, CT of the abdomen without contrast showed interval progression of acute pancreatitis with marked progressive edema involving the tail of pancreas with significant interval increase in peripancreatic inflammatory fat stranding and fluid.  He was started on IV fluids.  He subsequently went CT abdomen with IV contrast which showed possible early necrosis of distal tail of pancreas with developing fluid collection and thrombosis of splenic vein.  GI and oncology were consulted.  Assessment & Plan:   Acute severe pancreatitis with possible necrosis along with splenic vein thrombosis -CT of the abdomen with IV contrast showed possible early necrosis of distal tail of pancreas with developing fluid collection and thrombosis of splenic vein -GI and oncology have been consulted. -Continue IV fluids but decrease to 100 cc an hour, antiemetics as needed.  Currently on clear liquid diet.  Advance to full liquid diet today.  Avoid anticoagulation because of increased risk of bleeding as per oncology. -Patient still requiring intermittent IV Dilaudid  Hypertension -Blood pressure intermittently elevated.  Continue lisinopril  Diabetes mellitus type 2 with hyperglycemia -Continue CBGs with SSI.  Increase Levemir to 15 units daily.  Patient was recently discharged on 70/30 insulin.  Obesity -Outpatient follow-up  History of tobacco abuse and alcohol  abuse -Patient apparently quit smoking since 06/03/2021.  He also has not had any alcohol since his last admission on 06/03/2021. -Continue thiamine, folic acid and multivitamin  Leukocytosis -Resolved  Thrombocytosis -Possibly reactive.  Monitor  DVT prophylaxis: Lovenox Code Status: Full Family Communication: None at bedside Disposition Plan: Status is: Inpatient  Remains inpatient appropriate because:Inpatient level of care appropriate due to severity of illness  Dispo: The patient is from: Home              Anticipated d/c is to: Home              Patient currently is not medically stable to d/c.   Difficult to place patient No  Consultants: GI/oncology  Procedures: None  Antimicrobials: None   Subjective: Patient seen and examined at bedside.  No fever, vomiting, chest pain or worsening shortness of breath reported.  Still requiring IV Dilaudid for intermittent abdominal pain. Objective: Vitals:   06/14/21 0610 06/14/21 1240 06/14/21 2031 06/15/21 0507  BP: 139/81 (!) 152/98 (!) 169/92 129/79  Pulse: 90 98 87 71  Resp: 20 18 20 20   Temp: 98 F (36.7 C) (!) 97.2 F (36.2 C) 98 F (36.7 C) 98.3 F (36.8 C)  TempSrc: Oral Oral Oral Oral  SpO2: 97% 97% 99% 99%  Weight:      Height:       No intake or output data in the 24 hours ending 06/15/21 0759  Filed Weights   06/11/21 2331  Weight: 110.2 kg    Examination:  General exam: Currently on room air.  No acute distress. Respiratory system: Bilateral decreased breath sounds at bases cardiovascular system: S1-S2 heard, rate controlled  gastrointestinal system: Abdomen is slightly distended, soft and tender  in the epigastric and periumbilical regions.  Normal bowel sounds heard  extremities: No cyanosis; trace lower extremity edema present  Data Reviewed: I have personally reviewed following labs and imaging studies  CBC: Recent Labs  Lab 06/12/21 0011 06/12/21 0855 06/13/21 0602 06/14/21 1044  WBC  12.2* 10.3 9.5 9.1  NEUTROABS 7.8*  --   --  6.8  HGB 13.8 12.7* 12.4* 13.2  HCT 41.5 38.8* 36.8* 40.3  MCV 90.6 91.7 90.9 92.0  PLT 538* 533* 526* 588*    Basic Metabolic Panel: Recent Labs  Lab 06/12/21 0011 06/12/21 0855 06/13/21 0602 06/14/21 1044  NA 135 133* 138 132*  K 3.7 3.7 3.8 3.9  CL 98 97* 102 95*  CO2 24 26 26 26   GLUCOSE 270* 253* 230* 233*  BUN 9 9 6 6   CREATININE 1.07 1.05 0.71 0.79  CALCIUM 9.3 8.6* 8.7* 8.7*  MG  --   --   --  2.2    GFR: Estimated Creatinine Clearance: 151.6 mL/min (by C-G formula based on SCr of 0.79 mg/dL). Liver Function Tests: Recent Labs  Lab 06/12/21 0011 06/12/21 0855 06/14/21 1044  AST 35 28 42*  ALT 42 37 43  ALKPHOS 122 106 118  BILITOT 0.9 0.8 0.6  PROT 8.6* 7.9 8.1  ALBUMIN 3.1* 2.6* 2.8*    Recent Labs  Lab 06/12/21 0011 06/12/21 0855  LIPASE 129* 109*    No results for input(s): AMMONIA in the last 168 hours. Coagulation Profile: Recent Labs  Lab 06/12/21 1107  INR 1.1    Cardiac Enzymes: No results for input(s): CKTOTAL, CKMB, CKMBINDEX, TROPONINI in the last 168 hours. BNP (last 3 results) No results for input(s): PROBNP in the last 8760 hours. HbA1C: No results for input(s): HGBA1C in the last 72 hours. CBG: Recent Labs  Lab 06/14/21 0805 06/14/21 1225 06/14/21 1728 06/14/21 2033 06/15/21 0741  GLUCAP 236* 217* 288* 240* 206*    Lipid Profile: Recent Labs    06/12/21 0854  CHOL 142  HDL 21*  LDLCALC 91  TRIG 06/17/21*  CHOLHDL 6.8    Thyroid Function Tests: No results for input(s): TSH, T4TOTAL, FREET4, T3FREE, THYROIDAB in the last 72 hours. Anemia Panel: No results for input(s): VITAMINB12, FOLATE, FERRITIN, TIBC, IRON, RETICCTPCT in the last 72 hours. Sepsis Labs: No results for input(s): PROCALCITON, LATICACIDVEN in the last 168 hours.  Recent Results (from the past 240 hour(s))  Resp Panel by RT-PCR (Flu A&B, Covid) Nasopharyngeal Swab     Status: None   Collection Time:  06/12/21  6:36 AM   Specimen: Nasopharyngeal Swab; Nasopharyngeal(NP) swabs in vial transport medium  Result Value Ref Range Status   SARS Coronavirus 2 by RT PCR NEGATIVE NEGATIVE Final    Comment: (NOTE) SARS-CoV-2 target nucleic acids are NOT DETECTED.  The SARS-CoV-2 RNA is generally detectable in upper respiratory specimens during the acute phase of infection. The lowest concentration of SARS-CoV-2 viral copies this assay can detect is 138 copies/mL. A negative result does not preclude SARS-Cov-2 infection and should not be used as the sole basis for treatment or other patient management decisions. A negative result may occur with  improper specimen collection/handling, submission of specimen other than nasopharyngeal swab, presence of viral mutation(s) within the areas targeted by this assay, and inadequate number of viral copies(<138 copies/mL). A negative result must be combined with clinical observations, patient history, and epidemiological information. The expected result is Negative.  Fact Sheet for Patients:  213  Fact Sheet for  Healthcare Providers:  SeriousBroker.it  This test is no t yet approved or cleared by the [place] and  has been authorized for detection and/or diagnosis of SARS-CoV-2 by FDA under an Emergency Use Authorization (EUA). This EUA will remain  in effect (meaning this test can be used) for the duration of the COVID-19 declaration under Section 564(b)(1) of the Act, 21 U.S.C.section 360bbb-3(b)(1), unless the authorization is terminated  or revoked sooner.       Influenza A by PCR NEGATIVE NEGATIVE Final   Influenza B by PCR NEGATIVE NEGATIVE Final    Comment: (NOTE) The Xpert Xpress SARS-CoV-2/FLU/RSV plus assay is intended as an aid in the diagnosis of influenza from Nasopharyngeal swab specimens and should not be used as a sole basis for treatment. Nasal washings  and aspirates are unacceptable for Xpert Xpress SARS-CoV-2/FLU/RSV testing.  Fact Sheet for Patients: BloggerCourse.com  Fact Sheet for Healthcare Providers: SeriousBroker.it  This test is not yet approved or cleared by the [place] FDA and has been authorized for detection and/or diagnosis of SARS-CoV-2 by FDA under an Emergency Use Authorization (EUA). This EUA will remain in effect (meaning this test can be used) for the duration of the COVID-19 declaration under Section 564(b)(1) of the Act, 21 U.S.C. section 360bbb-3(b)(1), unless the authorization is terminated or revoked.  Performed at [facility], [address], [place], [place] [address]           Radiology Studies: No results found.      Scheduled Meds:  enoxaparin (LOVENOX) injection  40 mg Subcutaneous Q24H   folic acid  1 mg Oral Daily   insulin aspart  0-20 Units Subcutaneous TID WC   insulin aspart  0-5 Units Subcutaneous QHS   insulin detemir  10 Units Subcutaneous Daily   lisinopril  10 mg Oral Daily   multivitamin with minerals  1 tablet Oral Daily   thiamine  100 mg Oral Daily   Or   thiamine  100 mg Intravenous Daily   Continuous Infusions:  sodium chloride 125 mL/hr at 06/15/21 0506          Glade Lloyd, MD [facility] 06/15/2021, 7:59 AM

## 2021-06-15 NOTE — Progress Notes (Signed)
Subjective: Feeling well.  Pain is rated as a 4-5/10.    Objective: Vital signs in last 24 hours: Temp:  [97.8 F (36.6 C)-98.3 F (36.8 C)] 97.8 F (36.6 C) (08/30 1140) Pulse Rate:  [71-88] 88 (08/30 1140) Resp:  [20] 20 (08/30 1140) BP: (129-169)/(79-105) 143/105 (08/30 1140) SpO2:  [98 %-99 %] 98 % (08/30 1140) Last BM Date: 06/14/21  Intake/Output from previous day: No intake/output data recorded. Intake/Output this shift: Total I/O In: 220 [P.O.:220] Out: -   General appearance: alert and no distress GI: soft, non-tender; bowel sounds normal; no masses,  no organomegaly  Lab Results: Recent Labs    06/13/21 0602 06/14/21 1044  WBC 9.5 9.1  HGB 12.4* 13.2  HCT 36.8* 40.3  PLT 526* 588*   BMET Recent Labs    06/13/21 0602 06/14/21 1044  NA 138 132*  K 3.8 3.9  CL 102 95*  CO2 26 26  GLUCOSE 230* 233*  BUN 6 6  CREATININE 0.71 0.79  CALCIUM 8.7* 8.7*   LFT Recent Labs    06/14/21 1044  PROT 8.1  ALBUMIN 2.8*  AST 42*  ALT 43  ALKPHOS 118  BILITOT 0.6   PT/INR No results for input(s): LABPROT, INR in the last 72 hours. Hepatitis Panel No results for input(s): HEPBSAG, HCVAB, HEPAIGM, HEPBIGM in the last 72 hours. C-Diff No results for input(s): CDIFFTOX in the last 72 hours. Fecal Lactopherrin No results for input(s): FECLLACTOFRN in the last 72 hours.  Studies/Results: No results found.  Medications: Scheduled:  enoxaparin (LOVENOX) injection  40 mg Subcutaneous Q24H   folic acid  1 mg Oral Daily   insulin aspart  0-20 Units Subcutaneous TID WC   insulin aspart  0-5 Units Subcutaneous QHS   insulin detemir  15 Units Subcutaneous Daily   lisinopril  10 mg Oral Daily   multivitamin with minerals  1 tablet Oral Daily   thiamine  100 mg Oral Daily   Or   thiamine  100 mg Intravenous Daily   Continuous:  sodium chloride 100 mL/hr at 06/15/21 1107    Assessment/Plan: 1) Acute ETOH pancreatitis. 2) Possible early necrosis in the  tail of the pancreas. 3) Splenic vein thrombosis.   Today the patient appears to be very comfortable.  He is in bed without any distress enjoying television programs.  He feels that he is ready to eat a regular diet.  Plan: 1) Advance to a regular diet. 2) Monitory HCT as there was a note of an increase. 3) Continue with IV hydration. 4) Continue with Lovenox. 5) Signing off as the patient is progressing very well.  LOS: 3 days   Sherill Wegener D 06/15/2021, 3:50 PM

## 2021-06-16 DIAGNOSIS — K852 Alcohol induced acute pancreatitis without necrosis or infection: Principal | ICD-10-CM

## 2021-06-16 LAB — COMPREHENSIVE METABOLIC PANEL
ALT: 33 U/L (ref 0–44)
AST: 28 U/L (ref 15–41)
Albumin: 2.8 g/dL — ABNORMAL LOW (ref 3.5–5.0)
Alkaline Phosphatase: 83 U/L (ref 38–126)
Anion gap: 8 (ref 5–15)
BUN: 8 mg/dL (ref 6–20)
CO2: 25 mmol/L (ref 22–32)
Calcium: 9.2 mg/dL (ref 8.9–10.3)
Chloride: 106 mmol/L (ref 98–111)
Creatinine, Ser: 0.79 mg/dL (ref 0.61–1.24)
GFR, Estimated: >60 mL/min (ref >60)
Glucose, Bld: 238 mg/dL — ABNORMAL HIGH (ref 70–99)
Potassium: 4.3 mmol/L (ref 3.5–5.1)
Sodium: 139 mmol/L (ref 135–145)
Total Bilirubin: 0.5 mg/dL (ref 0.3–1.2)
Total Protein: 7.6 g/dL (ref 6.5–8.1)

## 2021-06-16 LAB — CBC WITH DIFFERENTIAL/PLATELET
Abs Immature Granulocytes: 0.13 10*3/uL — ABNORMAL HIGH (ref 0.00–0.07)
Basophils Absolute: 0.1 10*3/uL (ref 0.0–0.1)
Basophils Relative: 1 %
Eosinophils Absolute: 0.2 10*3/uL (ref 0.0–0.5)
Eosinophils Relative: 3 %
HCT: 38.8 % — ABNORMAL LOW (ref 39.0–52.0)
Hemoglobin: 12.8 g/dL — ABNORMAL LOW (ref 13.0–17.0)
Immature Granulocytes: 2 %
Lymphocytes Relative: 29 %
Lymphs Abs: 1.9 10*3/uL (ref 0.7–4.0)
MCH: 30 pg (ref 26.0–34.0)
MCHC: 33 g/dL (ref 30.0–36.0)
MCV: 90.9 fL (ref 80.0–100.0)
Monocytes Absolute: 0.6 10*3/uL (ref 0.1–1.0)
Monocytes Relative: 9 %
Neutro Abs: 3.8 10*3/uL (ref 1.7–7.7)
Neutrophils Relative %: 56 %
Platelets: 432 10*3/uL — ABNORMAL HIGH (ref 150–400)
RBC: 4.27 MIL/uL (ref 4.22–5.81)
RDW: 14.6 % (ref 11.5–15.5)
WBC: 6.7 10*3/uL (ref 4.0–10.5)
nRBC: 0 % (ref 0.0–0.2)

## 2021-06-16 LAB — MAGNESIUM: Magnesium: 2 mg/dL (ref 1.7–2.4)

## 2021-06-16 LAB — GLUCOSE, CAPILLARY
Glucose-Capillary: 250 mg/dL — ABNORMAL HIGH (ref 70–99)
Glucose-Capillary: 312 mg/dL — ABNORMAL HIGH (ref 70–99)

## 2021-06-16 MED ORDER — OXYCODONE HCL 5 MG PO TABS: 5.0000 mg | ORAL_TABLET | Freq: Four times a day (QID) | ORAL | 0 refills | Status: DC | PRN

## 2021-06-16 NOTE — Discharge Summary (Signed)
Physician Discharge Summary  Edward Ware BDZ:[id number] DOB: 11/18/1985 DOA: 06/11/2021  PCP: Patient, No Pcp Per (Inactive)  Admit date: 06/11/2021 Discharge date: 06/16/2021  Time spent: 50 minutes  Recommendations for Outpatient Follow-up:  Follow-up gastroenterology in 4 weeks  Discharge Diagnoses:  Principal Problem:   Acute pancreatitis Active Problems:   ETOH abuse   Tobacco abuse   Dehydration   Hepatic steatosis   Essential hypertension   T2DM (type 2 diabetes mellitus) ([place])   Obesity (BMI 30-39.9)   Splenic vein thrombosis   Discharge Condition: Stable  Diet recommendation: Low-fat diet  Filed Weights   06/11/21 2331  Weight: 110.2 kg    History of present illness:  35 year old male with history of recently diagnosed diabetes mellitus type 2, history of alcohol abuse, tobacco abuse, previous history of marijuana abuse, recent hospitalization from 06/03/2021 to 06/08/2021, for new DKA/diabetes mellitus type 2 and acute pancreatitis presented with worsening nausea and abdominal pain.  On presentation CT abdomen/pelvis without contrast showed interval progression of acute pancreatitis with marked progressive edema involving the tail of pancreas with significant interval increase in peripancreatic inflammatory fat stranding and fluid.  He was started on IV fluids, subsequent CT abdomen with contrast showed possible early necrosis of distal half of pancreas with developing fluid collection and thrombosis of splenic vein.  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  Avoid  anticoagulation due to increased risk of bleeding as per oncology and GI. -Patient's symptoms have significantly improved, gastroenterology has signed off -Lipase is down to 109 -We will discharge home on oxycodone 5 mg p.o. every 6 hours as needed for pain.  Hypertension -Continue lisinopril  Diabetes mellitus type 2 -Patient was discharged on insulin 70/30 15 units subcu twice daily, along with metformin 500 mg p.o. daily  History of alcohol abuse -Patient said he has not had alcohol since last admission -Continue thiamine, folic acid     Procedures:   Consultations: Gastroenterology Oncology  Discharge Exam: Vitals:   06/15/21 2012 06/16/21 0522  BP: (!) 147/98 120/82  Pulse: 98 77  Resp: 20 20  Temp: 99 F (37.2 C) 98.6 F (37 C)  SpO2: 97% 100%    General: Appears in no acute distress Cardiovascular: S1-S2, regular, no murmur auscultated Respiratory: Clear to auscultation bilaterally  Discharge Instructions   Discharge Instructions     Diet - low sodium heart healthy   Complete by: As directed    Increase activity slowly   Complete by: As directed       Allergies as of 06/16/2021   No Known Allergies      Medication List     TAKE these medications    atorvastatin 20 MG tablet Commonly known as: Lipitor Take 1 tablet (20 mg total) by mouth daily.   folic acid 1 MG tablet Commonly known as: FOLVITE Take 1 tablet (1 mg total) by mouth daily.   lisinopril 10 MG tablet Commonly known as: ZESTRIL Take 1 tablet (10 mg total) by mouth daily. What changed: Another medication with the same name was removed. Continue taking this medication, and follow the directions you see here.  metFORMIN 500 MG tablet Commonly known as: Glucophage Take 1 tablet (500 mg total) by mouth daily with breakfast. What changed: Another medication with the same name was removed. Continue taking this medication, and follow the directions you see here.   NovoLOG Mix  70/30 FlexPen (70-30) 100 UNIT/ML FlexPen Generic drug: insulin aspart protamine - aspart Inject 15 units into the skin 2 times daily What changed: Another medication with the same name was removed. Continue taking this medication, and follow the directions you see here.   oxyCODONE 5 MG immediate release tablet Commonly known as: Oxy IR/ROXICODONE Take 1 tablet (5 mg total) by mouth every 6 (six) hours as needed for moderate pain.   thiamine 100 MG tablet Take 1 tablet (100 mg total) by mouth daily.   True Metrix Blood Glucose Test test strip Generic drug: glucose blood use up to 4 times daily as directed.   True Metrix Meter w/Device Kit use up to 4 times daily as directed   TRUEplus Lancets 28G Misc use up to 4 times daily as directed.   TRUEplus Pen Needles 31G X 5 MM Misc Generic drug: Insulin Pen Needle use as directed       No Known Allergies    The results of significant diagnostics from this hospitalization (including imaging, microbiology, ancillary and laboratory) are listed below for reference.    Significant Diagnostic Studies: CT ABDOMEN PELVIS WO CONTRAST  Result Date: 06/12/2021 CLINICAL DATA:  Evaluate for acute pancreatitis. New diagnosis of diabetes. EXAM: CT ABDOMEN AND PELVIS WITHOUT CONTRAST TECHNIQUE: Multidetector CT imaging of the abdomen and pelvis was performed following the standard protocol without IV contrast. COMPARISON:  06/03/2021 FINDINGS: Lower chest: Subsegmental atelectasis identified within the anterior left lower lobe Hepatobiliary: Diffuse hepatic steatosis identified. No focal liver abnormality. Gallbladder appears unremarkable. No bile duct dilatation. Pancreas: Interval progression changes secondary to acute pancreatitis. There has been significant interval increase in edema/size of tail of pancreas. Progressive peripancreatic fat stranding and fluid now extends throughout the mesentery and into the left retroperitoneum. Assessment for  loculated fluid collection and pancreatic necrosis limited due to lack of IV contrast material. No obvious drainable fluid collections identified at this time. Spleen: Normal in size without focal abnormality. Adrenals/Urinary Tract: Adrenal glands are unremarkable. Kidneys are normal, without renal calculi, focal lesion, or hydronephrosis. Bladder is unremarkable. Stomach/Bowel: No gastric distension. No abnormal bowel wall thickening, inflammation or distension. The appendix is visualized and appears normal. Vascular/Lymphatic: No significant vascular findings are present. No enlarged abdominal or pelvic lymph nodes. Reproductive: Prostate is unremarkable. Other: No abdominal wall hernia or abnormality. Musculoskeletal: No acute or significant osseous findings. IMPRESSION: 1. Interval progression of acute pancreatitis. Marked progressive edema involves the tail of pancreas with significant interval increase in peripancreatic inflammatory fat stranding and fluid. 2. Assessment for loculated fluid collection and or pancreatic necrosis limited by lack of IV contrast material. No drainable fluid collection identified within these limitations. 3. Hepatic steatosis. Electronically Signed   By: Kerby Moors M.D.   On: 06/12/2021 05:23   CT ABDOMEN PELVIS WO CONTRAST  Result Date: 06/03/2021 CLINICAL DATA:  Diffuse abdominal pain with abdominal distension. Nausea. EXAM: CT ABDOMEN AND PELVIS WITHOUT CONTRAST TECHNIQUE: Multidetector CT imaging of the abdomen and pelvis was performed following the standard protocol without IV contrast. COMPARISON:  None. FINDINGS: Lower chest: The lung bases are clear.  The heart is normal in size. Hepatobiliary: Advanced hepatic steatosis with diffusely decreased hepatic density with areas of focal fatty sparing.  The liver is enlarged spanning 20.2 cm cranial caudal. There is no obvious focal hepatic lesion on this unenhanced exam. Possible sludge in the gallbladder without  calcified stone or pericholecystic inflammation. No biliary dilatation. Pancreas: Peripancreatic fat stranding with enlargement of the pancreatic head and uncinate process. Ill-defined stranding and fluid tracks into the right retroperitoneum in the anterior pararenal space. No pancreatic ductal dilatation. No acute peripancreatic collection. No parenchymal air. Spleen: Normal in size without focal abnormality. Adrenals/Urinary Tract: Normal adrenal glands. No hydronephrosis or renal calculi. No perinephric edema. No evidence of focal renal abnormality on this unenhanced exam. Partially distended urinary bladder Stomach/Bowel: Ingested material within the stomach. Mild wall thickening of the duodenum is felt to be reactive. Remainder of the small bowel is decompressed. Normal appendix. Small to moderate colonic stool burden. Vascular/Lymphatic: Normal caliber abdominal aorta. No aortic atherosclerosis. No portal venous or mesenteric gas. No abdominopelvic adenopathy. Reproductive: Prostate is unremarkable. Other: Peripancreatic fat stranding and inflammation with ill-defined free fluid tracking in the right retroperitoneum and anterior pararenal space. No organized collection or free air no abdominal wall hernia. Musculoskeletal: There are no acute or suspicious osseous abnormalities. There is a tiny vertebral body hemangioma within L4. Peripherally sclerotic lesion in the left inferior pubic ramus has a benign appearance. IMPRESSION: 1. Acute edematous pancreatitis. No acute peripancreatic collection or evidence of pancreatic necrosis on this unenhanced exam. 2. Advanced hepatic steatosis and hepatomegaly. Electronically Signed   By: Keith Rake M.D.   On: 06/03/2021 21:36   CT ABDOMEN PELVIS W CONTRAST  Result Date: 06/12/2021 CLINICAL DATA:  Pancreatitis. EXAM: CT ABDOMEN AND PELVIS WITH CONTRAST TECHNIQUE: Multidetector CT imaging of the abdomen and pelvis was performed using the standard protocol  following bolus administration of intravenous contrast. CONTRAST:  <See Chart> OMNIPAQUE IOHEXOL 350 MG/ML SOLN COMPARISON:  Earlier today FINDINGS: Lower chest: Subsegmental atelectasis noted in the left lung base. Hepatobiliary: No focal liver abnormality. Hepatic steatosis noted. Gallbladder normal. No bile duct dilatation. Pancreas: Changes of acute pancreatitis identified as described previously. This demonstrates significant progression when compared with the previous exam. There is diminished enhancement of the distal tail of pancreas which may reflect early necrosis. There is extensive peripancreatic inflammatory fat stranding with free fluid extending into the left retroperitoneum. Developing fluid collection along the greater curvature of the stomach is identified measuring 8.4 x 4.0 x 2.4 cm. This does not have a well-defined enhancing rind suggesting immature fluid collection/pseudocyst. Spleen: Normal in size without focal abnormality. Adrenals/Urinary Tract: Normal adrenal glands. No kidney mass or hydronephrosis. Urinary bladder appears normal. Stomach/Bowel: No bowel dilatation, wall thickening or inflammation identified. The appendix is visualized and appears normal. Vascular/Lymphatic: Normal caliber of the abdominal aorta. The portal vein and portal venous confluence are patent. 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 Increased left upper quadrant collaterals compared with 06/03/2021. Reproductive: Prostate is unremarkable. Other: Free fluid is identified extending along left retroperitoneum. Musculoskeletal: No acute or significant osseous findings. IMPRESSION: 1. Imaging findings compatible with acute pancreatitis. Significantly increased from 06/03/2021. There is diminished enhancement of the distal tail of pancreas which may reflect early necrosis. 2. Developing fluid collection along the greater curvature of the stomach is identified measuring 8.4 x 4.0 x 2.4 cm. This does not have a  well-defined enhancing rind suggesting immature fluid collection/pseudocyst. 3. Thrombosis of the splenic vein with increased left upper quadrant collaterals compared with 06/03/2021. 4. Hepatic steatosis. Electronically Signed   By: Kerby Moors M.D.   On: 06/12/2021 09:01  DG CHEST PORT 1 VIEW  Result Date: 06/03/2021 CLINICAL DATA:  DKA, upper abdominal pain EXAM: PORTABLE CHEST 1 VIEW COMPARISON:  None. FINDINGS: Cardiomediastinal silhouette is within normal limits. There is no focal airspace disease. There is no large pleural effusion or visible pneumothorax. There is no acute osseous abnormality. IMPRESSION: No evidence of acute cardiopulmonary disease. Electronically Signed   By: Maurine Simmering M.D.   On: 06/03/2021 21:21   DG Abd Portable 1V  Result Date: 06/05/2021 CLINICAL DATA:  Abdominal pain with pancreatitis EXAM: PORTABLE ABDOMEN - 1 VIEW COMPARISON:  06/03/2021 CT FINDINGS: Single supine view of the abdomen and pelvis demonstrates a nonobstructive bowel-gas pattern. No abnormal abdominal calcifications. No appendicolith. No gross free intraperitoneal air. IMPRESSION: No acute findings. Electronically Signed   By: Abigail Miyamoto M.D.   On: 06/05/2021 08:53    Microbiology: Recent Results (from the past 240 hour(s))  Resp Panel by RT-PCR (Flu A&B, Covid) Nasopharyngeal Swab     Status: None   Collection Time: 06/12/21  6:36 AM   Specimen: Nasopharyngeal Swab; Nasopharyngeal(NP) swabs in vial transport medium  Result Value Ref Range Status   SARS Coronavirus 2 by RT PCR NEGATIVE NEGATIVE Final    Comment: (NOTE) SARS-CoV-2 target nucleic acids are NOT DETECTED.  The SARS-CoV-2 RNA is generally detectable in upper respiratory specimens during the acute phase of infection. The lowest concentration of SARS-CoV-2 viral copies this assay can detect is 138 copies/mL. A negative result does not preclude SARS-Cov-2 infection and should not be used as the sole basis for treatment or other  patient management decisions. A negative result may occur with  improper specimen collection/handling, submission of specimen other than nasopharyngeal swab, presence of viral mutation(s) within the areas targeted by this assay, and inadequate number of viral copies(<138 copies/mL). A negative result must be combined with clinical observations, patient history, and epidemiological information. The expected result is Negative.  Fact Sheet for Patients:  EntrepreneurPulse.com.au  Fact Sheet for Healthcare Providers:  IncredibleEmployment.be  This test is no t yet approved or cleared by the [place] FDA and  has been authorized for detection and/or diagnosis of SARS-CoV-2 by FDA under an Emergency Use Authorization (EUA). This EUA will remain  in effect (meaning this test can be used) for the duration of the COVID-19 declaration under Section 564(b)(1) of the Act, 21 U.S.C.section 360bbb-3(b)(1), unless the authorization is terminated  or revoked sooner.       Influenza A by PCR NEGATIVE NEGATIVE Final   Influenza B by PCR NEGATIVE NEGATIVE Final    Comment: (NOTE) The Xpert Xpress SARS-CoV-2/FLU/RSV plus assay is intended as an aid in the diagnosis of influenza from Nasopharyngeal swab specimens and should not be used as a sole basis for treatment. Nasal washings and aspirates are unacceptable for Xpert Xpress SARS-CoV-2/FLU/RSV testing.  Fact Sheet for Patients: EntrepreneurPulse.com.au  Fact Sheet for Healthcare Providers: IncredibleEmployment.be  This test is not yet approved or cleared by the [place] FDA and has been authorized for detection and/or diagnosis of SARS-CoV-2 by FDA under an Emergency Use Authorization (EUA). This EUA will remain in effect (meaning this test can be used) for the duration of the COVID-19 declaration under Section 564(b)(1) of the Act, 21 U.S.C. section  360bbb-3(b)(1), unless the authorization is terminated or revoked.  Performed at [facility], [place] [address] [place], [place] [address]      Labs: Basic Metabolic Panel: Recent Labs  Lab 06/12/21 0011 06/12/21 7619 06/13/21 0602 06/14/21  1044 06/16/21 0544  NA 135 133* 138 132* 139  K 3.7 3.7 3.8 3.9 4.3  CL 98 97* 102 95* 106  CO2 _0 GLUCOSE 270* 253* 230* 233* 238*  BUN _1 CREATININE 1.07 1.05 0.71 0.79 0.79  CALCIUM 9.3 8.6* 8.7* 8.7* 9.2  MG  --   --   --  2.2 2.0   Liver Function Tests: Recent Labs  Lab 06/12/21 0011 06/12/21 0855 06/14/21 1044 06/16/21 0544  AST 35 28 42* 28  ALT 42 37 43 33  ALKPHOS 122 106 118 83  BILITOT 0.9 0.8 0.6 0.5  PROT 8.6* 7.9 8.1 7.6  ALBUMIN 3.1* 2.6* 2.8* 2.8*   Recent Labs  Lab 06/12/21 0011 06/12/21 0855  LIPASE 129* 109*   No results for input(s): AMMONIA in the last 168 hours. CBC: Recent Labs  Lab 06/12/21 0011 06/12/21 0855 06/13/21 0602 06/14/21 1044 06/16/21 0544  WBC 12.2* 10.3 9.5 9.1 6.7  NEUTROABS 7.8*  --   --  6.8 3.8  HGB 13.8 12.7* 12.4* 13.2 12.8*  HCT 41.5 38.8* 36.8* 40.3 38.8*  MCV 90.6 91.7 90.9 92.0 90.9  PLT 538* 533* 526* 588* 432*   Cardiac Enzymes: No results for input(s): CKTOTAL, CKMB, CKMBINDEX, TROPONINI in the last 168 hours. BNP: BNP (last 3 results) No results for input(s): BNP in the last 8760 hours.  ProBNP (last 3 results) No results for input(s): PROBNP in the last 8760 hours.  CBG: Recent Labs  Lab 06/15/21 1209 06/15/21 1801 06/15/21 2015 06/16/21 0724 06/16/21 1125  GLUCAP 239* 253* 319* 250* 312*       Signed:  Oswald Hillock MD.  [facility] 06/16/2021, 1:44 PM

## 2021-06-16 NOTE — Progress Notes (Signed)
Inpatient Diabetes Program Recommendations  AACE/ADA: New Consensus Statement on Inpatient Glycemic Control (2015)  Target Ranges:  Prepandial:   less than 140 mg/dL      Peak postprandial:   less than 180 mg/dL (1-2 hours)      Critically ill patients:  140 - 180 mg/dL   Lab Results  Component Value Date   GLUCAP 250 (H) 06/16/2021   HGBA1C 12.3 (H) 06/04/2021    Review of Glycemic Control  Diabetes history: DM2 Outpatient Diabetes medications: 70/30 15 units BID, metformin 500 mg QAM Current orders for Inpatient glycemic control: Levemir 15 units QD, Novolog 0-20 units TID with meals and 0-5 HS  HgbA1C - 12.3% Have spoken to pt and educated about his new diagnosis of DM at previous hospitalization last week. Was instructed on insulin pen administration. Has not started metformin yet.   Inpatient Diabetes Program Recommendations:    For home:  Novolog 70/30 15 units BID Metformin 500 mg BID  Will need to monitor blood sugars at least 3-4x/day and f/u with PCP within the next week.   Will speak with pt prior to discharge today.  Thank you. Ailene Ards, RD, LDN, CDE Inpatient Diabetes Coordinator [phone]

## 2021-06-18 ENCOUNTER — Other Ambulatory Visit (HOSPITAL_COMMUNITY): Payer: Self-pay

## 2021-06-18 ENCOUNTER — Telehealth: Payer: Self-pay | Admitting: Nurse Practitioner

## 2021-06-18 ENCOUNTER — Other Ambulatory Visit: Payer: Self-pay

## 2021-06-18 ENCOUNTER — Other Ambulatory Visit: Payer: Self-pay | Admitting: Nurse Practitioner

## 2021-06-18 MED ORDER — NOVOLOG MIX 70/30 FLEXPEN (70-30) 100 UNIT/ML ~~LOC~~ SUPN
15.0000 [IU] | PEN_INJECTOR | Freq: Two times a day (BID) | SUBCUTANEOUS | 0 refills | Status: DC
  Filled 2021-06-18: qty 3, fill #0

## 2021-06-18 NOTE — Telephone Encounter (Signed)
Medication Refill - Medication:  insulin aspart protamine - aspart (NOVOLOG 70/30 MIX) (70-30) 100 UNIT/ML FlexPen   Has the patient contacted their pharmacy? No.  Preferred Pharmacy (with phone number or street name):  [facility]  Phone:  [phone] Fax:  [phone]  Agent: Please be advised that RX refills may take up to 3 business days. We ask that you follow-up with your pharmacy.

## 2021-06-18 NOTE — Telephone Encounter (Signed)
Refilled today

## 2021-06-21 ENCOUNTER — Emergency Department (HOSPITAL_COMMUNITY): Payer: 59

## 2021-06-21 ENCOUNTER — Encounter (HOSPITAL_COMMUNITY): Payer: Self-pay | Admitting: *Deleted

## 2021-06-21 ENCOUNTER — Inpatient Hospital Stay (HOSPITAL_COMMUNITY)
Admission: EM | Admit: 2021-06-21 | Discharge: 2021-06-23 | DRG: 439 | Disposition: A | Discharge status: Home / Self Care | Payer: 59 | Attending: Internal Medicine | Admitting: Internal Medicine

## 2021-06-21 ENCOUNTER — Other Ambulatory Visit: Payer: Self-pay

## 2021-06-21 DIAGNOSIS — Z8249 Family history of ischemic heart disease and other diseases of the circulatory system: Secondary | ICD-10-CM

## 2021-06-21 DIAGNOSIS — Z20822 Contact with and (suspected) exposure to covid-19: Secondary | ICD-10-CM | POA: Diagnosis present

## 2021-06-21 DIAGNOSIS — F1010 Alcohol abuse, uncomplicated: Secondary | ICD-10-CM | POA: Diagnosis present

## 2021-06-21 DIAGNOSIS — Z79899 Other long term (current) drug therapy: Secondary | ICD-10-CM

## 2021-06-21 DIAGNOSIS — K8521 Alcohol induced acute pancreatitis with uninfected necrosis: Secondary | ICD-10-CM | POA: Diagnosis not present

## 2021-06-21 DIAGNOSIS — R109 Unspecified abdominal pain: Secondary | ICD-10-CM | POA: Diagnosis present

## 2021-06-21 DIAGNOSIS — K859 Acute pancreatitis without necrosis or infection, unspecified: Secondary | ICD-10-CM | POA: Diagnosis present

## 2021-06-21 DIAGNOSIS — K852 Alcohol induced acute pancreatitis without necrosis or infection: Secondary | ICD-10-CM

## 2021-06-21 DIAGNOSIS — Z794 Long term (current) use of insulin: Secondary | ICD-10-CM

## 2021-06-21 DIAGNOSIS — Z6839 Body mass index (BMI) 39.0-39.9, adult: Secondary | ICD-10-CM

## 2021-06-21 DIAGNOSIS — R188 Other ascites: Secondary | ICD-10-CM | POA: Diagnosis present

## 2021-06-21 DIAGNOSIS — Z833 Family history of diabetes mellitus: Secondary | ICD-10-CM

## 2021-06-21 DIAGNOSIS — E669 Obesity, unspecified: Secondary | ICD-10-CM | POA: Diagnosis present

## 2021-06-21 DIAGNOSIS — E119 Type 2 diabetes mellitus without complications: Secondary | ICD-10-CM | POA: Diagnosis present

## 2021-06-21 DIAGNOSIS — I10 Essential (primary) hypertension: Secondary | ICD-10-CM | POA: Diagnosis present

## 2021-06-21 DIAGNOSIS — E785 Hyperlipidemia, unspecified: Secondary | ICD-10-CM | POA: Diagnosis present

## 2021-06-21 DIAGNOSIS — Z7984 Long term (current) use of oral hypoglycemic drugs: Secondary | ICD-10-CM

## 2021-06-21 DIAGNOSIS — Z87891 Personal history of nicotine dependence: Secondary | ICD-10-CM

## 2021-06-21 DIAGNOSIS — Z86718 Personal history of other venous thrombosis and embolism: Secondary | ICD-10-CM

## 2021-06-21 LAB — CBC WITH DIFFERENTIAL/PLATELET
Abs Immature Granulocytes: 0.02 10*3/uL (ref 0.00–0.07)
Basophils Absolute: 0 10*3/uL (ref 0.0–0.1)
Basophils Relative: 1 %
Eosinophils Absolute: 0.1 10*3/uL (ref 0.0–0.5)
Eosinophils Relative: 2 %
HCT: 44.5 % (ref 39.0–52.0)
Hemoglobin: 14.4 g/dL (ref 13.0–17.0)
Immature Granulocytes: 0 %
Lymphocytes Relative: 20 %
Lymphs Abs: 1.7 10*3/uL (ref 0.7–4.0)
MCH: 29.9 pg (ref 26.0–34.0)
MCHC: 32.4 g/dL (ref 30.0–36.0)
MCV: 92.3 fL (ref 80.0–100.0)
Monocytes Absolute: 0.6 10*3/uL (ref 0.1–1.0)
Monocytes Relative: 7 %
Neutro Abs: 6.3 10*3/uL (ref 1.7–7.7)
Neutrophils Relative %: 70 %
Platelets: 688 10*3/uL — ABNORMAL HIGH (ref 150–400)
RBC: 4.82 MIL/uL (ref 4.22–5.81)
RDW: 14.4 % (ref 11.5–15.5)
WBC: 8.9 10*3/uL (ref 4.0–10.5)
nRBC: 0 % (ref 0.0–0.2)

## 2021-06-21 LAB — COMPREHENSIVE METABOLIC PANEL
ALT: 40 U/L (ref 0–44)
AST: 21 U/L (ref 15–41)
Albumin: 3.7 g/dL (ref 3.5–5.0)
Alkaline Phosphatase: 67 U/L (ref 38–126)
Anion gap: 10 (ref 5–15)
BUN: 12 mg/dL (ref 6–20)
CO2: 22 mmol/L (ref 22–32)
Calcium: 9.7 mg/dL (ref 8.9–10.3)
Chloride: 101 mmol/L (ref 98–111)
Creatinine, Ser: 0.94 mg/dL (ref 0.61–1.24)
GFR, Estimated: >60 mL/min (ref >60)
Glucose, Bld: 204 mg/dL — ABNORMAL HIGH (ref 70–99)
Potassium: 4.7 mmol/L (ref 3.5–5.1)
Sodium: 133 mmol/L — ABNORMAL LOW (ref 135–145)
Total Bilirubin: 0.7 mg/dL (ref 0.3–1.2)
Total Protein: 8.7 g/dL — ABNORMAL HIGH (ref 6.5–8.1)

## 2021-06-21 LAB — URINALYSIS, ROUTINE W REFLEX MICROSCOPIC
Bacteria, UA: NONE SEEN
Bilirubin Urine: NEGATIVE
Glucose, UA: NEGATIVE mg/dL
Hgb urine dipstick: NEGATIVE
Ketones, ur: NEGATIVE mg/dL
Leukocytes,Ua: NEGATIVE
Nitrite: NEGATIVE
Protein, ur: NEGATIVE mg/dL
Specific Gravity, Urine: 1.02 (ref 1.005–1.030)
pH: 6 (ref 5.0–8.0)

## 2021-06-21 LAB — CBG MONITORING, ED: Glucose-Capillary: 203 mg/dL — ABNORMAL HIGH (ref 70–99)

## 2021-06-21 LAB — GLUCOSE, CAPILLARY
Glucose-Capillary: 98 mg/dL (ref 70–99)
Glucose-Capillary: 170 mg/dL — ABNORMAL HIGH (ref 70–99)

## 2021-06-21 LAB — RESP PANEL BY RT-PCR (FLU A&B, COVID) ARPGX2
Influenza A by PCR: NEGATIVE
Influenza B by PCR: NEGATIVE
SARS Coronavirus 2 by RT PCR: NEGATIVE

## 2021-06-21 LAB — LIPASE, BLOOD: Lipase: 112 U/L — ABNORMAL HIGH (ref 11–51)

## 2021-06-21 MED ORDER — ONDANSETRON HCL 4 MG/2ML IJ SOLN
4.0000 mg | Freq: Four times a day (QID) | INTRAMUSCULAR | Status: DC | PRN
  Filled 2021-06-21: qty 2

## 2021-06-21 MED ORDER — INSULIN ASPART 100 UNIT/ML IJ SOLN
0.0000 [IU] | Freq: Three times a day (TID) | INTRAMUSCULAR | Status: DC
  Administered 2021-06-22 – 2021-06-23 (×2): 3 [IU] via SUBCUTANEOUS
  Administered 2021-06-23: 4 [IU] via SUBCUTANEOUS

## 2021-06-21 MED ORDER — IOHEXOL 350 MG/ML SOLN
80.0000 mL | Freq: Once | INTRAVENOUS | Status: AC | PRN
  Administered 2021-06-21: 80 mL via INTRAVENOUS

## 2021-06-21 MED ORDER — SODIUM CHLORIDE 0.9 % IV BOLUS
1000.0000 mL | Freq: Once | INTRAVENOUS | Status: AC
  Administered 2021-06-21: 1000 mL via INTRAVENOUS

## 2021-06-21 MED ORDER — ONDANSETRON HCL 4 MG/2ML IJ SOLN
4.0000 mg | Freq: Once | INTRAMUSCULAR | Status: AC
  Administered 2021-06-21: 4 mg via INTRAVENOUS
  Filled 2021-06-21: qty 2

## 2021-06-21 MED ORDER — FOLIC ACID 5 MG/ML IJ SOLN
1.0000 mg | Freq: Every day | INTRAMUSCULAR | Status: DC
  Administered 2021-06-21 – 2021-06-23 (×3): 1 mg via INTRAVENOUS
  Filled 2021-06-21 (×3): qty 0.2

## 2021-06-21 MED ORDER — MORPHINE SULFATE (PF) 4 MG/ML IV SOLN
4.0000 mg | Freq: Once | INTRAVENOUS | Status: AC
  Administered 2021-06-21: 4 mg via INTRAVENOUS
  Filled 2021-06-21: qty 1

## 2021-06-21 MED ORDER — MORPHINE SULFATE (PF) 4 MG/ML IV SOLN
4.0000 mg | INTRAVENOUS | Status: DC | PRN
  Administered 2021-06-21 – 2021-06-23 (×12): 4 mg via INTRAVENOUS
  Filled 2021-06-21 (×12): qty 1

## 2021-06-21 MED ORDER — LIP MEDEX EX OINT
TOPICAL_OINTMENT | CUTANEOUS | Status: AC
  Filled 2021-06-21: qty 7

## 2021-06-21 MED ORDER — ENOXAPARIN SODIUM 60 MG/0.6ML IJ SOSY
60.0000 mg | PREFILLED_SYRINGE | INTRAMUSCULAR | Status: DC
  Administered 2021-06-21 – 2021-06-22 (×2): 60 mg via SUBCUTANEOUS
  Filled 2021-06-21 (×2): qty 0.6

## 2021-06-21 MED ORDER — DOXYLAMINE SUCCINATE (SLEEP) 25 MG PO TABS
25.0000 mg | ORAL_TABLET | Freq: Every evening | ORAL | Status: AC | PRN
  Administered 2021-06-21: 25 mg via ORAL
  Filled 2021-06-21: qty 1

## 2021-06-21 MED ORDER — ONDANSETRON HCL 4 MG PO TABS: 4.0000 mg | ORAL_TABLET | Freq: Four times a day (QID) | ORAL | Status: DC | PRN

## 2021-06-21 MED ORDER — THIAMINE HCL 100 MG/ML IJ SOLN
100.0000 mg | Freq: Every day | INTRAMUSCULAR | Status: DC
  Administered 2021-06-21 – 2021-06-23 (×3): 100 mg via INTRAVENOUS
  Filled 2021-06-21 (×3): qty 2

## 2021-06-21 MED ORDER — HYDRALAZINE HCL 20 MG/ML IJ SOLN: 10.0000 mg | Freq: Three times a day (TID) | INTRAMUSCULAR | Status: DC | PRN

## 2021-06-21 MED ORDER — SODIUM CHLORIDE 0.9 % IV SOLN: INTRAVENOUS | Status: DC

## 2021-06-21 MED ORDER — INSULIN ASPART 100 UNIT/ML IJ SOLN: 0.0000 [IU] | Freq: Every day | INTRAMUSCULAR | Status: DC

## 2021-06-21 NOTE — ED Triage Notes (Signed)
Pt w/ hx of pancreatitis complains of abdominal pain, left shoulder pain. Pain started yesterday.

## 2021-06-21 NOTE — Progress Notes (Signed)
[facility] rm 1516, Edward Ware, 35. Asking for Ambien for sleep. [place] Spaniel it was given him last admission. Takes Unisom at home.

## 2021-06-21 NOTE — ED Provider Notes (Signed)
[facility] DEPT Provider Note   CSN: [id number] Arrival date & time: 06/21/21  0807     History No chief complaint on file.   Edward Ware is a 35 y.o. male.  HPI  Patient with history of type 2 diabetes and pancreatitis presents with abdominal pain.  Patient has been having abdominal pain since his acute pancreatitis episode 06/11/2021, but states yesterday the pain was more severe than normal.  He took 2 hydrocodone, 2 Vicodin, and 2 Percocet.  States that he bought the [employer] off a friend.  States that around 3 AM this morning the abdominal pain became acute, more severe than ever.  It is in the epigastric area and radiates to his back and feels like his initial presentation of pancreatitis.  He has nauseated, but denies any vomiting.  His last bowel movement yesterday, he is passing gas.  Moving and walking is an aggravating factor, pain medicine did alleviate the pain somewhat.  He does have associated shortness of breath when the pain sets on, but denies any chest pain.  The pain he has some left shoulder pain that is primarily in the back and radiates down paraspinally.  He was also seen 06/03/2021 for DKA requiring hospital admission.  Past Medical History:  Diagnosis Date   Diabetes mellitus without complication ([place])    Hypertension     Patient Active Problem List   Diagnosis Date Noted   Splenic vein thrombosis    Acute pancreatitis 06/12/2021   Hepatic steatosis 06/12/2021   Essential hypertension 06/12/2021   T2DM (type 2 diabetes mellitus) ([place]) 06/12/2021   Obesity (BMI 30-39.9) 06/12/2021   DKA (diabetic ketoacidosis) ([place]) 06/03/2021   ETOH abuse 06/03/2021   Tobacco abuse 06/03/2021   Pancreatitis 06/03/2021   Dehydration 06/03/2021    No past surgical history on file.     Family History  Problem Relation Age of Onset   Diabetes Mother    Hypertension Mother    Diabetes Other     Social History   Tobacco Use    Smoking status: Former   Smokeless tobacco: Never  Substance Use Topics   Alcohol use: Yes    Comment: 1 pint a night of alcohol   Drug use: No    Home Medications Prior to Admission medications   Medication Sig Start Date End Date Taking? Authorizing Provider  atorvastatin (LIPITOR) 20 MG tablet Take 1 tablet (20 mg total) by mouth daily. 06/08/21 06/08/22  Oswald Hillock, MD  Blood Glucose Monitoring Suppl (TRUE METRIX METER) w/Device KIT use up to 4 times daily as directed 06/08/21   Oswald Hillock, MD  folic acid (FOLVITE) 1 MG tablet Take 1 tablet (1 mg total) by mouth daily. 06/09/21   Oswald Hillock, MD  glucose blood test strip use up to 4 times daily as directed. 06/08/21   Oswald Hillock, MD  insulin aspart protamine - aspart (NOVOLOG MIX 70/30 FLEXPEN) (70-30) 100 UNIT/ML FlexPen Inject 15 Units into the skin 2 (two) times daily. 06/18/21   Bo Merino I, NP  Insulin Pen Needle 31G X 5 MM MISC use as directed 06/08/21   Oswald Hillock, MD  lisinopril (ZESTRIL) 10 MG tablet Take 1 tablet (10 mg total) by mouth daily. 06/08/21 06/08/22  Oswald Hillock, MD  metFORMIN (GLUCOPHAGE) 500 MG tablet Take 1 tablet (500 mg total) by mouth daily with breakfast. 06/08/21 06/08/22  Oswald Hillock, MD  oxyCODONE (OXY IR/ROXICODONE) 5 MG immediate  release tablet Take 1 tablet (5 mg total) by mouth every 6 (six) hours as needed for moderate pain. 06/16/21   Oswald Hillock, MD  thiamine 100 MG tablet Take 1 tablet (100 mg total) by mouth daily. 06/09/21   Oswald Hillock, MD  TRUEplus Lancets 28G MISC use up to 4 times daily as directed. 06/08/21   Oswald Hillock, MD    Allergies    Patient has no known allergies.  Review of Systems   Review of Systems  Constitutional:  Negative for fatigue and fever.  HENT:  Negative for congestion.   Respiratory:  Positive for shortness of breath. Negative for cough.   Cardiovascular:  Negative for chest pain and leg swelling.  Gastrointestinal:  Positive for abdominal  distention, abdominal pain and nausea. Negative for constipation, diarrhea and vomiting.  Genitourinary:  Negative for dysuria and hematuria.  Musculoskeletal:  Positive for back pain.   Physical Exam Updated Vital Signs BP 133/80 (BP Location: Right Arm)   Pulse (!) 115   Temp 99.1 F (37.3 C) (Oral)   Resp 17   Ht $R'5\' 6"'qH$  (1.676 m)   Wt 110.2 kg   SpO2 100%   BMI 39.22 kg/m   Physical Exam Vitals and nursing note reviewed. Exam conducted with a chaperone present.  Constitutional:      Appearance: Normal appearance. He is obese.     Comments: Patient appears uncomfortable  HENT:     Head: Normocephalic and atraumatic.  Eyes:     General: No scleral icterus.       Right eye: No discharge.        Left eye: No discharge.     Extraocular Movements: Extraocular movements intact.     Pupils: Pupils are equal, round, and reactive to light.  Cardiovascular:     Rate and Rhythm: Regular rhythm. Tachycardia present.     Pulses: Normal pulses.     Heart sounds: Normal heart sounds. No murmur heard.   No friction rub. No gallop.  Pulmonary:     Effort: Pulmonary effort is normal. No respiratory distress.     Breath sounds: Normal breath sounds.  Abdominal:     General: Abdomen is flat. Bowel sounds are normal. There is distension.     Palpations: Abdomen is soft.     Tenderness: There is abdominal tenderness in the epigastric area. There is guarding. There is no right CVA tenderness, left CVA tenderness or rebound.     Comments: Epigastric tenderness with voluntary guarding.  Abdomen is somewhat distended although difficult to tell from his body habitus.  No CVA tenderness.  Skin:    General: Skin is warm and dry.     Coloration: Skin is not jaundiced.  Neurological:     Mental Status: He is alert. Mental status is at baseline.     Coordination: Coordination normal.    ED Results / Procedures / Treatments   Labs (all labs ordered are listed, but only abnormal results are  displayed) Labs Reviewed  CBG MONITORING, ED - Abnormal; Notable for the following components:      Result Value   Glucose-Capillary 203 (*)    All other components within normal limits  RESP PANEL BY RT-PCR (FLU A&B, COVID) ARPGX2  CBC WITH DIFFERENTIAL/PLATELET  COMPREHENSIVE METABOLIC PANEL  LIPASE, BLOOD  URINALYSIS, ROUTINE W REFLEX MICROSCOPIC    EKG None  Radiology No results found.  Procedures Procedures   Medications Ordered in ED Medications - No data to  display  ED Course  I have reviewed the triage vital signs and the nursing notes.  Pertinent labs & imaging results that were available during my care of the patient were reviewed by me and considered in my medical decision making (see chart for details).  Clinical Course as of 06/21/21 1053  Mon Jun 21, 2021  0946 CBC with Differential(!) Thrombocytosis consistent with alcohol abuse.  No leukocytosis, no anemia [HS]  0946 Comprehensive metabolic panel(!) No gross electrolyte derangement, no LFT elevation, no anion gap or BUN or bicarbonate changes consistent with DKA. [HS]  [id number] Urinalysis, Routine w reflex microscopic Urine, Clean Catch(!) No UTI, no red blood cells or hemoglobin in urine consistent with kidney stone. [HS]    Clinical Course User Index [HS] Sherrill Raring, PA-C   MDM Rules/Calculators/A&P                           Patient is tachycardic, otherwise vitals are stable.  He does have voluntary guarding and epigastric pain with radiation to the back which consistent with pancreatitis, in the setting of recent diagnosis of acute pancreatitis will order CT imaging to assess for complications such as cyst, abscess, necrotizing pancreatitis.  Will give fluids and pain medicine for now keep patient NPO.  We will check basic labs and get a COVID test.  CT results and shows findings consistent with an CT was done during hospital admission.   1. Evolving changes of acute pancreatitis with interval  improvement  in the acute peripancreatic fluid collection anterior to the  pancreatic tail. Stable diminished enhancement within the distal  pancreatic tail which may reflect necrosis.  2. Increased pelvic ascites. No new or enlarging focal fluid  collections identified.  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  4. Heterogeneous hepatic steatosis.  5. Fluctuating atelectasis at both lung bases.   We will put in consult to general surgery for their advice on how to proceed.  Spoke with Dr. Armandina Gemma with general surgery, he does not think there is anything for them to do surgically but he will review the CT and put in any input as he sees fit. Spoke with Dr. Marylyn Ishihara, he agrees the patient is appropriate for admission for pancreatitis.  Final Clinical Impression(s) / ED Diagnoses Final diagnoses:  None    Rx / DC Orders ED Discharge Orders     None        Sherrill Raring, PA-C 39/53/20 2334    Gray, [place] P, DO 35/68/61 1603

## 2021-06-21 NOTE — H&P (Signed)
History and Physical    Edward Ware GYI:[id number] DOB: Dec 09, 1985 DOA: 06/21/2021  PCP: Patient, No Pcp Per (Inactive)  Patient coming from: Home  Chief Complaint: Abdominal pain  HPI: Edward Ware is a 35 y.o. male with medical history significant of DM2, Hx of EtOH abuse, HTN. Presenting with abdominal pain. Multiple recent admissions for pancreatitis. Last discharge was 8/31. He was ok for a few days, but 2 days ago, his pain returned. Left side pain radiating to back. Tried his vicodin, but it didn't provide complete resolution. Yesterday, his pain worsened and increased vicodin did not help. When he symptoms didn't resolve this morning, he decided to come to the ED. He denies any other aggravating or alleviating factors.   ED Course: Found to have elevated lipase. CT ab/pelvis concerning for pancreatitis. General surgery was consulted. [facility] was called for admission.    Review of Systems:  Denies CP, dyspnea, diarrhea, vomiting, fever. Reports abdominal pain, nausea. Review of systems is otherwise negative for all not mentioned in HPI.   PMHx Past Medical History:  Diagnosis Date   Diabetes mellitus without complication ([place])    Hypertension     PSHx Denies past surgical history  SocHx  reports that he has quit smoking. He has never used smokeless tobacco. He reports current alcohol use. He reports that he does not use drugs.  No Known Allergies  FamHx Family History  Problem Relation Age of Onset   Diabetes Mother    Hypertension Mother    Diabetes Other     Prior to Admission medications   Medication Sig Start Date End Date Taking? Authorizing Provider  atorvastatin (LIPITOR) 20 MG tablet Take 1 tablet (20 mg total) by mouth daily. 06/08/21 06/08/22  Oswald Hillock, MD  Blood Glucose Monitoring Suppl (TRUE METRIX METER) w/Device KIT use up to 4 times daily as directed 06/08/21   Oswald Hillock, MD  folic acid (FOLVITE) 1 MG tablet Take 1 tablet (1 mg total) by  mouth daily. 06/09/21   Oswald Hillock, MD  glucose blood test strip use up to 4 times daily as directed. 06/08/21   Oswald Hillock, MD  insulin aspart protamine - aspart (NOVOLOG MIX 70/30 FLEXPEN) (70-30) 100 UNIT/ML FlexPen Inject 15 Units into the skin 2 (two) times daily. 06/18/21   Bo Merino I, NP  Insulin Pen Needle 31G X 5 MM MISC use as directed 06/08/21   Oswald Hillock, MD  lisinopril (ZESTRIL) 10 MG tablet Take 1 tablet (10 mg total) by mouth daily. 06/08/21 06/08/22  Oswald Hillock, MD  metFORMIN (GLUCOPHAGE) 500 MG tablet Take 1 tablet (500 mg total) by mouth daily with breakfast. 06/08/21 06/08/22  Oswald Hillock, MD  oxyCODONE (OXY IR/ROXICODONE) 5 MG immediate release tablet Take 1 tablet (5 mg total) by mouth every 6 (six) hours as needed for moderate pain. 06/16/21   Oswald Hillock, MD  thiamine 100 MG tablet Take 1 tablet (100 mg total) by mouth daily. 06/09/21   Oswald Hillock, MD  TRUEplus Lancets 28G MISC use up to 4 times daily as directed. 06/08/21   Oswald Hillock, MD    Physical Exam: Vitals:   06/21/21 0814 06/21/21 0815  BP: 133/80   Pulse: (!) 115   Resp: 17   Temp: 99.1 F (37.3 C)   TempSrc: Oral   SpO2: 100%   Weight:  110.2 kg  Height:  _0  (1.676 m)    General: 35  y.o. male resting in bed in NAD Eyes: PERRL, normal sclera ENMT: Nares patent w/o discharge, orophaynx clear, dentition normal, ears w/o discharge/lesions/ulcers Neck: Supple, trachea midline Cardiovascular: RRR, +S1, S2, no m/g/r, equal pulses throughout Respiratory: CTABL, no w/r/r, normal WOB GI: BS+, ND, LUQ/LLQ TTP, no masses noted, no organomegaly noted MSK: No e/c/c Skin: No rashes, bruises, ulcerations noted Neuro: A&O x 3, no focal deficits Psyc: Appropriate interaction and affect, calm/cooperative  Labs on Admission: I have personally reviewed following labs and imaging studies  CBC: Recent Labs  Lab 06/16/21 0544 06/21/21 0838  WBC 6.7 8.9  NEUTROABS 3.8 6.3  HGB 12.8* 14.4   HCT 38.8* 44.5  MCV 90.9 92.3  PLT 432* 564*   Basic Metabolic Panel: Recent Labs  Lab 06/16/21 0544 06/21/21 0838  NA 139 133*  K 4.3 4.7  CL 106 101  CO2 25 22  GLUCOSE 238* 204*  BUN 8 12  CREATININE 0.79 0.94  CALCIUM 9.2 9.7  MG 2.0  --    GFR: Estimated Creatinine Clearance: 129.1 mL/min (by C-G formula based on SCr of 0.94 mg/dL). Liver Function Tests: Recent Labs  Lab 06/16/21 0544 06/21/21 0838  AST 28 21  ALT 33 40  ALKPHOS 83 67  BILITOT 0.5 0.7  PROT 7.6 8.7*  ALBUMIN 2.8* 3.7   Recent Labs  Lab 06/21/21 0838  LIPASE 112*   No results for input(s): AMMONIA in the last 168 hours. Coagulation Profile: No results for input(s): INR, PROTIME in the last 168 hours. Cardiac Enzymes: No results for input(s): CKTOTAL, CKMB, CKMBINDEX, TROPONINI in the last 168 hours. BNP (last 3 results) No results for input(s): PROBNP in the last 8760 hours. HbA1C: No results for input(s): HGBA1C in the last 72 hours. CBG: Recent Labs  Lab 06/15/21 1801 06/15/21 2015 06/16/21 0724 06/16/21 1125 06/21/21 0833  GLUCAP 253* 319* 250* 312* 203*   Lipid Profile: No results for input(s): CHOL, HDL, LDLCALC, TRIG, CHOLHDL, LDLDIRECT in the last 72 hours. Thyroid Function Tests: No results for input(s): TSH, T4TOTAL, FREET4, T3FREE, THYROIDAB in the last 72 hours. Anemia Panel: No results for input(s): VITAMINB12, FOLATE, FERRITIN, TIBC, IRON, RETICCTPCT in the last 72 hours. Urine analysis:    Component Value Date/Time   COLORURINE YELLOW (A) 06/21/2021 0838   APPEARANCEUR CLEAR (A) 06/21/2021 0838   LABSPEC 1.020 06/21/2021 0838   PHURINE 6.0 06/21/2021 0838   GLUCOSEU NEGATIVE 06/21/2021 0838   HGBUR NEGATIVE 06/21/2021 0838   BILIRUBINUR NEGATIVE 06/21/2021 0838   KETONESUR NEGATIVE 06/21/2021 0838   PROTEINUR NEGATIVE 06/21/2021 0838   UROBILINOGEN 0.2 10/08/2013 1230   NITRITE NEGATIVE 06/21/2021 0838   LEUKOCYTESUR NEGATIVE 06/21/2021 0838     Radiological Exams on Admission: CT Abdomen Pelvis W Contrast  Result Date: 06/21/2021 CLINICAL DATA:  Mid abdominal and back pain for 18 days. Worsening pain since last night. Diagnosed with pancreatitis. EXAM: CT ABDOMEN AND PELVIS WITH CONTRAST TECHNIQUE: Multidetector CT imaging of the abdomen and pelvis was performed using the standard protocol following bolus administration of intravenous contrast. CONTRAST:  57m OMNIPAQUE IOHEXOL 350 MG/ML SOLN COMPARISON:  CT 06/12/2021 and 06/03/2021. FINDINGS: Lower chest: Subsegmental atelectasis at both lung bases with interval worsening on the right and improvement on the left. No significant pleural or pericardial effusion. Hepatobiliary: Heterogeneous hepatic steatosis again noted. No evidence of focal liver lesion. No evidence of gallstones, gallbladder wall thickening or biliary dilatation. Pancreas: Changes of acute pancreatitis are again demonstrated with stable decreased enhancement distally within the  pancreatic tail which may reflect necrosis. There is normal enhancement of the pancreatic body and head. No evidence of pancreatic hemorrhage. Acute peripancreatic fluid collection anterior to the pancreatic tail has slightly decreased in size, measuring 6.5 x 1.9 cm on image 33/2 (previously 8.4 x 4.0 cm). No enlarging focal fluid collections are identified. Inflammatory changes in the base of the mesentery are grossly stable. Spleen: Normal in size without focal abnormality. Adrenals/Urinary Tract: Both adrenal glands appear normal. The kidneys appear normal without evidence of urinary tract calculus, suspicious lesion or hydronephrosis. No bladder abnormalities are seen. Stomach/Bowel: No enteric contrast administered. The stomach appears unremarkable for its degree of distension. No evidence of bowel wall thickening, distention or surrounding inflammatory change. The appendix appears normal. Vascular/Lymphatic: There are no enlarged abdominal or pelvic  lymph nodes. Stable small reactive lymph nodes in the gastrohepatic ligament. No acute arterial findings are identified. 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 Discrete intraluminal thrombus in the vessel is no longer seen. Reproductive: The prostate gland appears unremarkable. Other: Increased intermediate density pelvic ascites.  No free air. Musculoskeletal: No acute or significant osseous findings. IMPRESSION: 1. Evolving changes of acute pancreatitis with interval improvement in the acute peripancreatic fluid collection anterior to the pancreatic tail. Stable diminished enhancement within the distal pancreatic tail which may reflect necrosis. 2. Increased pelvic ascites. No new or enlarging focal fluid collections identified. 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 4. Heterogeneous hepatic steatosis. 5. Fluctuating atelectasis at both lung bases. Electronically Signed   By: Richardean Sale M.D.   On: 06/21/2021 09:46    EKG: None obtained in the ED  Assessment/Plan Pancreatitis w/ necrosis Intractable abdominal pain     - place in obs, med-surg     - NPO except ice chips/sips     - IVF, anti-emetics, pain control     - necrosis seen on CT; EDP spoke with general surgery, awaiting final recs  Splenic vein thrombosis recently diagnosed     - recent admission for pancreatitis; at that time, splenic vein thrombosis; recommendation was to hold off on full anticoag d/t increased risk of bleeding  DM2     - SSI, glucose checks     - Last A1c was 12.3 on 06/04/21     - NPO for now d/t above  HTN     - NPO, will resume home meds when he comes off NPO status     - PRN antihypertensives on order  HLD     - resume home regimen when he comes off NPO status  DVT prophylaxis: lovenox  Code Status: FULL  Family Communication: w/ mother at bedside Consults called: EDP spoke with general surgery   Status is:  Observation  The patient remains OBS appropriate and will d/c before 2 midnights.  Dispo: The patient is from: Home              Anticipated d/c is to: Home              Patient currently is not medically stable to d/c.   Difficult to place patient No  Time spent coordinating admission: 70 minutes  [facility]  If 7PM-7AM, please contact night-coverage [email or web address]  06/21/2021, 10:54 AM

## 2021-06-21 NOTE — Plan of Care (Signed)
  Problem: Education: Goal: Knowledge of General Education information will improve Description: Including pain rating scale, medication(s)/side effects and non-pharmacologic comfort measures Outcome: Progressing   Problem: Clinical Measurements: Goal: Will remain free from infection Outcome: Progressing Goal: Diagnostic test results will improve Outcome: Progressing   Problem: Activity: Goal: Risk for activity intolerance will decrease Outcome: Progressing   Problem: Nutrition: Goal: Adequate nutrition will be maintained Outcome: Progressing   Problem: Pain Managment: Goal: General experience of comfort will improve Outcome: Progressing   Problem: Safety: Goal: Ability to remain free from injury will improve Outcome: Progressing

## 2021-06-22 ENCOUNTER — Ambulatory Visit: Payer: Self-pay | Admitting: Nurse Practitioner

## 2021-06-22 ENCOUNTER — Encounter (HOSPITAL_COMMUNITY): Payer: Self-pay | Admitting: Family Medicine

## 2021-06-22 DIAGNOSIS — Z8249 Family history of ischemic heart disease and other diseases of the circulatory system: Secondary | ICD-10-CM | POA: Diagnosis not present

## 2021-06-22 DIAGNOSIS — Z7984 Long term (current) use of oral hypoglycemic drugs: Secondary | ICD-10-CM | POA: Diagnosis not present

## 2021-06-22 DIAGNOSIS — Z794 Long term (current) use of insulin: Secondary | ICD-10-CM | POA: Diagnosis not present

## 2021-06-22 DIAGNOSIS — K852 Alcohol induced acute pancreatitis without necrosis or infection: Secondary | ICD-10-CM

## 2021-06-22 DIAGNOSIS — Z833 Family history of diabetes mellitus: Secondary | ICD-10-CM | POA: Diagnosis not present

## 2021-06-22 DIAGNOSIS — Z79899 Other long term (current) drug therapy: Secondary | ICD-10-CM | POA: Diagnosis not present

## 2021-06-22 DIAGNOSIS — Z20822 Contact with and (suspected) exposure to covid-19: Secondary | ICD-10-CM | POA: Diagnosis present

## 2021-06-22 DIAGNOSIS — E119 Type 2 diabetes mellitus without complications: Secondary | ICD-10-CM | POA: Diagnosis present

## 2021-06-22 DIAGNOSIS — Z87891 Personal history of nicotine dependence: Secondary | ICD-10-CM | POA: Diagnosis not present

## 2021-06-22 DIAGNOSIS — Z86718 Personal history of other venous thrombosis and embolism: Secondary | ICD-10-CM | POA: Diagnosis not present

## 2021-06-22 DIAGNOSIS — I10 Essential (primary) hypertension: Secondary | ICD-10-CM | POA: Diagnosis present

## 2021-06-22 DIAGNOSIS — F1010 Alcohol abuse, uncomplicated: Secondary | ICD-10-CM | POA: Diagnosis present

## 2021-06-22 DIAGNOSIS — E785 Hyperlipidemia, unspecified: Secondary | ICD-10-CM | POA: Diagnosis present

## 2021-06-22 DIAGNOSIS — E669 Obesity, unspecified: Secondary | ICD-10-CM | POA: Diagnosis present

## 2021-06-22 DIAGNOSIS — R109 Unspecified abdominal pain: Secondary | ICD-10-CM | POA: Diagnosis present

## 2021-06-22 DIAGNOSIS — Z6839 Body mass index (BMI) 39.0-39.9, adult: Secondary | ICD-10-CM | POA: Diagnosis not present

## 2021-06-22 DIAGNOSIS — R188 Other ascites: Secondary | ICD-10-CM | POA: Diagnosis present

## 2021-06-22 DIAGNOSIS — K8521 Alcohol induced acute pancreatitis with uninfected necrosis: Secondary | ICD-10-CM | POA: Diagnosis present

## 2021-06-22 DIAGNOSIS — R1013 Epigastric pain: Secondary | ICD-10-CM

## 2021-06-22 DIAGNOSIS — K859 Acute pancreatitis without necrosis or infection, unspecified: Secondary | ICD-10-CM | POA: Diagnosis present

## 2021-06-22 LAB — COMPREHENSIVE METABOLIC PANEL
ALT: 26 U/L (ref 0–44)
AST: 14 U/L — ABNORMAL LOW (ref 15–41)
Albumin: 3.2 g/dL — ABNORMAL LOW (ref 3.5–5.0)
Alkaline Phosphatase: 62 U/L (ref 38–126)
Anion gap: 9 (ref 5–15)
BUN: 11 mg/dL (ref 6–20)
CO2: 25 mmol/L (ref 22–32)
Calcium: 9.1 mg/dL (ref 8.9–10.3)
Chloride: 102 mmol/L (ref 98–111)
Creatinine, Ser: 1.08 mg/dL (ref 0.61–1.24)
GFR, Estimated: >60 mL/min (ref >60)
Glucose, Bld: 147 mg/dL — ABNORMAL HIGH (ref 70–99)
Potassium: 4.5 mmol/L (ref 3.5–5.1)
Sodium: 136 mmol/L (ref 135–145)
Total Bilirubin: 0.9 mg/dL (ref 0.3–1.2)
Total Protein: 7.5 g/dL (ref 6.5–8.1)

## 2021-06-22 LAB — CBC
HCT: 40.3 % (ref 39.0–52.0)
Hemoglobin: 12.9 g/dL — ABNORMAL LOW (ref 13.0–17.0)
MCH: 29.6 pg (ref 26.0–34.0)
MCHC: 32 g/dL (ref 30.0–36.0)
MCV: 92.4 fL (ref 80.0–100.0)
Platelets: 473 10*3/uL — ABNORMAL HIGH (ref 150–400)
RBC: 4.36 MIL/uL (ref 4.22–5.81)
RDW: 14.4 % (ref 11.5–15.5)
WBC: 6.6 10*3/uL (ref 4.0–10.5)
nRBC: 0 % (ref 0.0–0.2)

## 2021-06-22 LAB — GLUCOSE, CAPILLARY
Glucose-Capillary: 154 mg/dL — ABNORMAL HIGH (ref 70–99)
Glucose-Capillary: 162 mg/dL — ABNORMAL HIGH (ref 70–99)
Glucose-Capillary: 103 mg/dL — ABNORMAL HIGH (ref 70–99)
Glucose-Capillary: 193 mg/dL — ABNORMAL HIGH (ref 70–99)

## 2021-06-22 MED ORDER — LISINOPRIL 10 MG PO TABS
10.0000 mg | ORAL_TABLET | Freq: Every day | ORAL | Status: DC
Start: 2021-06-22 — End: 2021-06-23
  Administered 2021-06-22 – 2021-06-23 (×2): 10 mg via ORAL
  Filled 2021-06-22 (×2): qty 1

## 2021-06-22 NOTE — Progress Notes (Signed)
Subjective: Feeling okay.  He wants to try a clear liquid diet.  Objective: Vital signs in last 24 hours: Temp:  [98.1 F (36.7 C)-98.9 F (37.2 C)] 98.9 F (37.2 C) (09/06 0555) Pulse Rate:  [77-110] 97 (09/06 0555) Resp:  [16-20] 16 (09/06 0555) BP: (109-131)/(63-87) 109/69 (09/06 0555) SpO2:  [96 %-100 %] 98 % (09/06 0555)    Intake/Output from previous day: 09/05 0701 - 09/06 0700 In: 1633.5 [I.V.:1633.5] Out: 950 [Urine:950] Intake/Output this shift: No intake/output data recorded.  General appearance: alert and mild distress GI: upper abdominal tenderness  Lab Results: Recent Labs    06/21/21 0838 06/22/21 0444  WBC 8.9 6.6  HGB 14.4 12.9*  HCT 44.5 40.3  PLT 688* 473*   BMET Recent Labs    06/21/21 0838 06/22/21 0444  NA 133* 136  K 4.7 4.5  CL 101 102  CO2 22 25  GLUCOSE 204* 147*  BUN 12 11  CREATININE 0.94 1.08  CALCIUM 9.7 9.1   LFT Recent Labs    06/22/21 0444  PROT 7.5  ALBUMIN 3.2*  AST 14*  ALT 26  ALKPHOS 62  BILITOT 0.9   PT/INR No results for input(s): LABPROT, INR in the last 72 hours. Hepatitis Panel No results for input(s): HEPBSAG, HCVAB, HEPAIGM, HEPBIGM in the last 72 hours. C-Diff No results for input(s): CDIFFTOX in the last 72 hours. Fecal Lactopherrin No results for input(s): FECLLACTOFRN in the last 72 hours.  Studies/Results: CT Abdomen Pelvis W Contrast  Result Date: 06/21/2021 CLINICAL DATA:  Mid abdominal and back pain for 18 days. Worsening pain since last night. Diagnosed with pancreatitis. EXAM: CT ABDOMEN AND PELVIS WITH CONTRAST TECHNIQUE: Multidetector CT imaging of the abdomen and pelvis was performed using the standard protocol following bolus administration of intravenous contrast. CONTRAST:  47mL OMNIPAQUE IOHEXOL 350 MG/ML SOLN COMPARISON:  CT 06/12/2021 and 06/03/2021. FINDINGS: Lower chest: Subsegmental atelectasis at both lung bases with interval worsening on the right and improvement on the left. No  significant pleural or pericardial effusion. Hepatobiliary: Heterogeneous hepatic steatosis again noted. No evidence of focal liver lesion. No evidence of gallstones, gallbladder wall thickening or biliary dilatation. Pancreas: Changes of acute pancreatitis are again demonstrated with stable decreased enhancement distally within the pancreatic tail which may reflect necrosis. There is normal enhancement of the pancreatic body and head. No evidence of pancreatic hemorrhage. Acute peripancreatic fluid collection anterior to the pancreatic tail has slightly decreased in size, measuring 6.5 x 1.9 cm on image 33/2 (previously 8.4 x 4.0 cm). No enlarging focal fluid collections are identified. Inflammatory changes in the base of the mesentery are grossly stable. Spleen: Normal in size without focal abnormality. Adrenals/Urinary Tract: Both adrenal glands appear normal. The kidneys appear normal without evidence of urinary tract calculus, suspicious lesion or hydronephrosis. No bladder abnormalities are seen. Stomach/Bowel: No enteric contrast administered. The stomach appears unremarkable for its degree of distension. No evidence of bowel wall thickening, distention or surrounding inflammatory change. The appendix appears normal. Vascular/Lymphatic: There are no enlarged abdominal or pelvic lymph nodes. Stable small reactive lymph nodes in the gastrohepatic ligament. No acute arterial findings are identified. 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 Discrete intraluminal thrombus in the vessel is no longer seen. Reproductive: The prostate gland appears unremarkable. Other: Increased intermediate density pelvic ascites.  No free air. Musculoskeletal: No acute or significant osseous findings. IMPRESSION: 1. Evolving changes of acute pancreatitis with interval improvement in  the acute peripancreatic fluid collection anterior to the pancreatic tail. Stable  diminished enhancement within the distal pancreatic tail which may reflect necrosis. 2. Increased pelvic ascites. No new or enlarging focal fluid collections identified. 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 4. Heterogeneous hepatic steatosis. 5. Fluctuating atelectasis at both lung bases. Electronically Signed   By: Carey Bullocks M.Ware.   On: 06/21/2021 09:46    Medications: Scheduled:  enoxaparin (LOVENOX) injection  60 mg Subcutaneous Q24H   folic acid  1 mg Intravenous Daily   insulin aspart  0-15 Units Subcutaneous TID WC   insulin aspart  0-5 Units Subcutaneous QHS   thiamine injection  100 mg Intravenous Daily   Continuous:  sodium chloride 100 mL/hr at 06/22/21 1032    Assessment/Plan: 1) Persistent ETOH acute pancreatitis. 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 3) Abdominal and back pain.   This is his third admission for the same issue.  His CT scan shows an improvement in the pancreatitis and stable pancreatic necrosis.  A new finding is the moderate ascites.  He states that he was well, but he consumed more pain medications when his pain acutely worsened.    Plan: 1) Continue with pain control. 2) Continue with IV hydration. 3) Advance to clear liquids.  LOS: 0 days   Edward Ware 06/22/2021, 11:08 AM

## 2021-06-22 NOTE — Progress Notes (Addendum)
[facility]  PROGRESS NOTE  Carrolyn LeighFreddrick A Harmsen ZOX:[id number] DOB: [birth year] DOA: 06/21/2021 PCP: Patient, No Pcp Per (Inactive)   Brief HPI:   35 year old male with medical history of diabetes mellitus type 2, alcohol abuse, hypertension, recent diagnosis of pancreatitis who had multiple admissions for acute pancreatitis.  He was discharged on 8/31.  Patient says that he was fine for 2 days but then started having abdominal pain.  In the ED he was found to have lipase 112, CT abdomen/pelvis showed improving pancreatitis with moderate ascites.    Subjective   Patient seen, complains of abdominal pain with radiation to back.  No nausea or vomiting.   Assessment/Plan:     Intractable abdominal pain -Secondary to pancreatitis with necrosis in the tail of pancreas -Called and discussed with Dr. Elnoria HowardHung, he recommended to continue pain medications -Continue IV normal saline at 100 mL/h -Continue morphine as needed for pain, Zofran as needed for nausea vomiting -Started on clear liquid diet  Diabetes mellitus type 2 -Continue sliding scale insulin with NovoLog -CBG well controlled  Hypertension -We will restart lisinopril 10 mg daily -Blood pressure is well controlled    Scheduled medications:    enoxaparin (LOVENOX) injection  60 mg Subcutaneous Q24H   folic acid  1 mg Intravenous Daily   insulin aspart  0-15 Units Subcutaneous TID WC   insulin aspart  0-5 Units Subcutaneous QHS   thiamine injection  100 mg Intravenous Daily         Data Reviewed:   CBG:  Recent Labs  Lab 06/21/21 1607 06/21/21 2111 06/22/21 0735 06/22/21 1137 06/22/21 1646  GLUCAP 98 170* 154* 162* 103*    SpO2: 99 %    Vitals:   06/21/21 2107 06/22/21 0123 06/22/21 0555 06/22/21 1100  BP: 119/69 117/63 109/69 122/76  Pulse: (!) 110 99 97 87  Resp: 16 16 16 18   Temp: 98.1 F (36.7 C) 98.8 F (37.1 C) 98.9 F (37.2 C) 98.2 F (36.8 C)  TempSrc: Oral Oral Oral Oral  SpO2: 97%  100% 98% 99%  Weight:      Height:         Intake/Output Summary (Last 24 hours) at 06/22/2021 1841 Last data filed at 06/22/2021 1300 Gross per 24 hour  Intake 2379.62 ml  Output 950 ml  Net 1429.62 ml    09/04 1901 - 09/06 0700 In: 1633.5 [I.V.:1633.5] Out: 950 [Urine:950]  Filed Weights   06/21/21 0815  Weight: 110.2 kg    CBC:  Recent Labs  Lab 06/16/21 0544 06/21/21 0838 06/22/21 0444  WBC 6.7 8.9 6.6  HGB 12.8* 14.4 12.9*  HCT 38.8* 44.5 40.3  PLT 432* 688* 473*  MCV 90.9 92.3 92.4  MCH 30.0 29.9 29.6  MCHC 33.0 32.4 32.0  RDW 14.6 14.4 14.4  LYMPHSABS 1.9 1.7  --   MONOABS 0.6 0.6  --   EOSABS 0.2 0.1  --   BASOSABS 0.1 0.0  --     Complete metabolic panel:  Recent Labs  Lab 06/16/21 0544 06/21/21 0838 06/22/21 0444  NA 139 133* 136  K 4.3 4.7 4.5  CL 106 101 102  CO2 25 22 25   GLUCOSE 238* 204* 147*  BUN 8 12 11   CREATININE 0.79 0.94 1.08  CALCIUM 9.2 9.7 9.1  AST 28 21 14*  ALT 33 40 26  ALKPHOS 83 67 62  BILITOT 0.5 0.7 0.9  ALBUMIN 2.8* 3.7 3.2*  MG 2.0  --   --  Recent Labs  Lab 06/21/21 0838  LIPASE 112*    Recent Labs  Lab 06/21/21 0838  SARSCOV2NAA NEGATIVE    ------------------------------------------------------------------------------------------------------------------ No results for input(s): CHOL, HDL, LDLCALC, TRIG, CHOLHDL, LDLDIRECT in the last 72 hours.  Lab Results  Component Value Date   HGBA1C 12.3 (H) 06/04/2021   ------------------------------------------------------------------------------------------------------------------ No results for input(s): TSH, T4TOTAL, T3FREE, THYROIDAB in the last 72 hours.  Invalid input(s): FREET3 ------------------------------------------------------------------------------------------------------------------ No results for input(s): VITAMINB12, FOLATE, FERRITIN, TIBC, IRON, RETICCTPCT in the last 72 hours.  Coagulation profile No results for input(s): INR,  PROTIME in the last 168 hours. No results for input(s): DDIMER in the last 72 hours.  Cardiac Enzymes No results for input(s): CKTOTAL, CKMB, CKMBINDEX, TROPONINI in the last 168 hours.  ------------------------------------------------------------------------------------------------------------------ No results found for: BNP   Antibiotics: Anti-infectives (From admission, onward)    None        Radiology Reports  CT Abdomen Pelvis W Contrast  Result Date: 06/21/2021 CLINICAL DATA:  Mid abdominal and back pain for 18 days. Worsening pain since last night. Diagnosed with pancreatitis. EXAM: CT ABDOMEN AND PELVIS WITH CONTRAST TECHNIQUE: Multidetector CT imaging of the abdomen and pelvis was performed using the standard protocol following bolus administration of intravenous contrast. CONTRAST:  52mL OMNIPAQUE IOHEXOL 350 MG/ML SOLN COMPARISON:  CT 06/12/2021 and 06/03/2021. FINDINGS: Lower chest: Subsegmental atelectasis at both lung bases with interval worsening on the right and improvement on the left. No significant pleural or pericardial effusion. Hepatobiliary: Heterogeneous hepatic steatosis again noted. No evidence of focal liver lesion. No evidence of gallstones, gallbladder wall thickening or biliary dilatation. Pancreas: Changes of acute pancreatitis are again demonstrated with stable decreased enhancement distally within the pancreatic tail which may reflect necrosis. There is normal enhancement of the pancreatic body and head. No evidence of pancreatic hemorrhage. Acute peripancreatic fluid collection anterior to the pancreatic tail has slightly decreased in size, measuring 6.5 x 1.9 cm on image 33/2 (previously 8.4 x 4.0 cm). No enlarging focal fluid collections are identified. Inflammatory changes in the base of the mesentery are grossly stable. Spleen: Normal in size without focal abnormality. Adrenals/Urinary Tract: Both adrenal glands appear normal. The kidneys appear normal  without evidence of urinary tract calculus, suspicious lesion or hydronephrosis. No bladder abnormalities are seen. Stomach/Bowel: No enteric contrast administered. The stomach appears unremarkable for its degree of distension. No evidence of bowel wall thickening, distention or surrounding inflammatory change. The appendix appears normal. Vascular/Lymphatic: There are no enlarged abdominal or pelvic lymph nodes. Stable small reactive lymph nodes in the gastrohepatic ligament. No acute arterial findings are identified. 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 Discrete intraluminal thrombus in the vessel is no longer seen. Reproductive: The prostate gland appears unremarkable. Other: Increased intermediate density pelvic ascites.  No free air. Musculoskeletal: No acute or significant osseous findings. IMPRESSION: 1. Evolving changes of acute pancreatitis with interval improvement in the acute peripancreatic fluid collection anterior to the pancreatic tail. Stable diminished enhancement within the distal pancreatic tail which may reflect necrosis. 2. Increased pelvic ascites. No new or enlarging focal fluid collections identified. 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 4. Heterogeneous hepatic steatosis. 5. Fluctuating atelectasis at both lung bases. Electronically Signed   By: Carey Bullocks M.D.   On: 06/21/2021 09:46      DVT prophylaxis: Lovenox  Code Status: Full code  Family Communication: No family at bedside   Consultants:   Procedures:  Objective    Physical Examination:   General-appears in no acute distress Heart-S1-S2, regular, no murmur auscultated Lungs-clear to auscultation bilaterally, no wheezing or crackles auscultated Abdomen-soft, mild epigastric tenderness to palpation , no organomegaly Extremities-no edema in the lower extremities Neuro-alert, oriented x3, no focal deficit  noted  Status is: Inpatient  Dispo: The patient is from: Home              Anticipated d/c is to: Home              Anticipated d/c date is: 06/24/2021              Patient currently not stable for discharge  Barrier to discharge-pain due to pancreatitis  COVID-19 Labs  No results for input(s): DDIMER, FERRITIN, LDH, CRP in the last 72 hours.  Lab Results  Component Value Date   SARSCOV2NAA NEGATIVE 06/21/2021   SARSCOV2NAA NEGATIVE 06/12/2021   SARSCOV2NAA NEGATIVE 06/03/2021    Microbiology  Recent Results (from the past 240 hour(s))  Resp Panel by RT-PCR (Flu A&B, Covid) Nasopharyngeal Swab     Status: None   Collection Time: 06/21/21  8:38 AM   Specimen: Nasopharyngeal Swab; Nasopharyngeal(NP) swabs in vial transport medium  Result Value Ref Range Status   SARS Coronavirus 2 by RT PCR NEGATIVE NEGATIVE Final    Comment: (NOTE) SARS-CoV-2 target nucleic acids are NOT DETECTED.  The SARS-CoV-2 RNA is generally detectable in upper respiratory specimens during the acute phase of infection. The lowest concentration of SARS-CoV-2 viral copies this assay can detect is 138 copies/mL. A negative result does not preclude SARS-Cov-2 infection and should not be used as the sole basis for treatment or other patient management decisions. A negative result may occur with  improper specimen collection/handling, submission of specimen other than nasopharyngeal swab, presence of viral mutation(s) within the areas targeted by this assay, and inadequate number of viral copies(<138 copies/mL). A negative result must be combined with clinical observations, patient history, and epidemiological information. The expected result is Negative.  Fact Sheet for Patients:  BloggerCourse.com  Fact Sheet for Healthcare Providers:  SeriousBroker.it  This test is no t yet approved or cleared by the [place] FDA and  has been authorized for  detection and/or diagnosis of SARS-CoV-2 by FDA under an Emergency Use Authorization (EUA). This EUA will remain  in effect (meaning this test can be used) for the duration of the COVID-19 declaration under Section 564(b)(1) of the Act, 21 U.S.C.section 360bbb-3(b)(1), unless the authorization is terminated  or revoked sooner.       Influenza A by PCR NEGATIVE NEGATIVE Final   Influenza B by PCR NEGATIVE NEGATIVE Final    Comment: (NOTE) The Xpert Xpress SARS-CoV-2/FLU/RSV plus assay is intended as an aid in the diagnosis of influenza from Nasopharyngeal swab specimens and should not be used as a sole basis for treatment. Nasal washings and aspirates are unacceptable for Xpert Xpress SARS-CoV-2/FLU/RSV testing.  Fact Sheet for Patients: BloggerCourse.com  Fact Sheet for Healthcare Providers: SeriousBroker.it  This test is not yet approved or cleared by the [place] FDA and has been authorized for detection and/or diagnosis of SARS-CoV-2 by FDA under an Emergency Use Authorization (EUA). This EUA will remain in effect (meaning this test can be used) for the duration of the COVID-19 declaration under Section 564(b)(1) of the Act, 21 U.S.C. section 360bbb-3(b)(1), unless the authorization is terminated or revoked.  Performed at [facility], [address], [place], [place] [address]  Meredeth Ide   [facility] If 7PM-7AM, please contact night-coverage at [email or web address], Office  [phone]   06/22/2021, 6:41 PM  LOS: 0 days

## 2021-06-23 LAB — COMPREHENSIVE METABOLIC PANEL
ALT: 27 U/L (ref 0–44)
AST: 20 U/L (ref 15–41)
Albumin: 3 g/dL — ABNORMAL LOW (ref 3.5–5.0)
Alkaline Phosphatase: 78 U/L (ref 38–126)
Anion gap: 7 (ref 5–15)
BUN: 7 mg/dL (ref 6–20)
CO2: 27 mmol/L (ref 22–32)
Calcium: 9.1 mg/dL (ref 8.9–10.3)
Chloride: 106 mmol/L (ref 98–111)
Creatinine, Ser: 0.87 mg/dL (ref 0.61–1.24)
GFR, Estimated: >60 mL/min (ref >60)
Glucose, Bld: 164 mg/dL — ABNORMAL HIGH (ref 70–99)
Potassium: 4.5 mmol/L (ref 3.5–5.1)
Sodium: 140 mmol/L (ref 135–145)
Total Bilirubin: 0.5 mg/dL (ref 0.3–1.2)
Total Protein: 7.5 g/dL (ref 6.5–8.1)

## 2021-06-23 LAB — GLUCOSE, CAPILLARY
Glucose-Capillary: 195 mg/dL — ABNORMAL HIGH (ref 70–99)
Glucose-Capillary: 186 mg/dL — ABNORMAL HIGH (ref 70–99)

## 2021-06-23 MED ORDER — IBUPROFEN 600 MG PO TABS: 600.0000 mg | ORAL_TABLET | Freq: Four times a day (QID) | ORAL | 0 refills | Status: AC | PRN

## 2021-06-23 MED ORDER — OXYCODONE HCL 5 MG PO TABS: 5.0000 mg | ORAL_TABLET | Freq: Four times a day (QID) | ORAL | 0 refills | Status: DC | PRN

## 2021-06-23 MED ORDER — OXYCODONE HCL 5 MG PO TABS: 5.0000 mg | ORAL_TABLET | Freq: Four times a day (QID) | ORAL | Status: DC | PRN

## 2021-06-23 MED ORDER — IBUPROFEN 200 MG PO TABS: 600.0000 mg | ORAL_TABLET | Freq: Four times a day (QID) | ORAL | Status: DC | PRN

## 2021-06-23 NOTE — Discharge Summary (Signed)
Physician Discharge Summary  Edward Ware QMG:[id number] DOB: [birth year] DOA: 06/21/2021  PCP: Patient, No Pcp Per (Inactive)  Admit date: 06/21/2021 Discharge date: 06/23/2021  Admitted From: home Discharge disposition: home   Recommendations for Outpatient Follow-Up:   Alcohol cessation and strict low fat diet   Discharge Diagnosis:   Active Problems:   Pancreatitis   Abdominal pain    Discharge Condition: Improved.  Diet recommendation: low fat.  Wound care: None.  Code status: Full.   History of Present Illness:   Edward Ware is a 35 y.o. male with medical history significant of DM2, Hx of EtOH abuse, HTN. Presenting with abdominal pain. Multiple recent admissions for pancreatitis. Last discharge was 8/31. He was ok for a few days, but 2 days ago, his pain returned. Left side pain radiating to back. Tried his vicodin, but it didn't provide complete resolution. Yesterday, his pain worsened and increased vicodin did not help. When he symptoms didn't resolve this morning, he decided to come to the ED. He denies any other aggravating or alleviating factors.      Hospital Course by Problem:   Pancreatitis -much improved -tolerated full liquid, will need slow advancement to a strictly low fat diet-- patient educated -NSAIDs for pain control   Diabetes mellitus type 2 -Continue sliding scale insulin with NovoLog -CBG well controlled   Hypertension -We will restart lisinopril 10 mg daily -Blood pressure is well controlled    Medical Consultants:   GI   Discharge Exam:   Vitals:   06/23/21 0514 06/23/21 1219  BP: 129/85 129/78  Pulse: 95 85  Resp: 20 20  Temp: 98.1 F (36.7 C) 98.3 F (36.8 C)  SpO2: 97% 97%   Vitals:   06/22/21 1100 06/22/21 1954 06/23/21 0514 06/23/21 1219  BP: 122/76 121/70 129/85 129/78  Pulse: 87 (!) 108 95 85  Resp: $Remo'18 20 20 20  'cOLkE$ Temp: 98.2 F (36.8 C) 98.6 F (37 C) 98.1 F (36.7 C) 98.3 F (36.8 C)   TempSrc: Oral Oral Oral Oral  SpO2: 99% 96% 97% 97%  Weight:      Height:        General exam: Appears calm and comfortable.    The results of significant diagnostics from this hospitalization (including imaging, microbiology, ancillary and laboratory) are listed below for reference.     Procedures and Diagnostic Studies:   CT Abdomen Pelvis W Contrast  Result Date: 06/21/2021 CLINICAL DATA:  Mid abdominal and back pain for 18 days. Worsening pain since last night. Diagnosed with pancreatitis. EXAM: CT ABDOMEN AND PELVIS WITH CONTRAST TECHNIQUE: Multidetector CT imaging of the abdomen and pelvis was performed using the standard protocol following bolus administration of intravenous contrast. CONTRAST:  41mL OMNIPAQUE IOHEXOL 350 MG/ML SOLN COMPARISON:  CT 06/12/2021 and 06/03/2021. FINDINGS: Lower chest: Subsegmental atelectasis at both lung bases with interval worsening on the right and improvement on the left. No significant pleural or pericardial effusion. Hepatobiliary: Heterogeneous hepatic steatosis again noted. No evidence of focal liver lesion. No evidence of gallstones, gallbladder wall thickening or biliary dilatation. Pancreas: Changes of acute pancreatitis are again demonstrated with stable decreased enhancement distally within the pancreatic tail which may reflect necrosis. There is normal enhancement of the pancreatic body and head. No evidence of pancreatic hemorrhage. Acute peripancreatic fluid collection anterior to the pancreatic tail has slightly decreased in size, measuring 6.5 x 1.9 cm on image 33/2 (previously 8.4 x 4.0 cm). No enlarging focal fluid collections are  identified. Inflammatory changes in the base of the mesentery are grossly stable. Spleen: Normal in size without focal abnormality. Adrenals/Urinary Tract: Both adrenal glands appear normal. The kidneys appear normal without evidence of urinary tract calculus, suspicious lesion or hydronephrosis. No bladder  abnormalities are seen. Stomach/Bowel: No enteric contrast administered. The stomach appears unremarkable for its degree of distension. No evidence of bowel wall thickening, distention or surrounding inflammatory change. The appendix appears normal. Vascular/Lymphatic: There are no enlarged abdominal or pelvic lymph nodes. Stable small reactive lymph nodes in the gastrohepatic ligament. No acute arterial findings are identified. 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 Discrete intraluminal thrombus in the vessel is no longer seen. Reproductive: The prostate gland appears unremarkable. Other: Increased intermediate density pelvic ascites.  No free air. Musculoskeletal: No acute or significant osseous findings. IMPRESSION: 1. Evolving changes of acute pancreatitis with interval improvement in the acute peripancreatic fluid collection anterior to the pancreatic tail. Stable diminished enhancement within the distal pancreatic tail which may reflect necrosis. 2. Increased pelvic ascites. No new or enlarging focal fluid collections identified. 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 4. Heterogeneous hepatic steatosis. 5. Fluctuating atelectasis at both lung bases. Electronically Signed   By: Richardean Sale M.D.   On: 06/21/2021 09:46     Labs:   Basic Metabolic Panel: Recent Labs  Lab 06/21/21 0838 06/22/21 0444 06/23/21 0500  NA 133* 136 140  K 4.7 4.5 4.5  CL 101 102 106  CO2 $Re'22 25 27  'cRf$ GLUCOSE 204* 147* 164*  BUN $Re'12 11 7  'eLJ$ CREATININE 0.94 1.08 0.87  CALCIUM 9.7 9.1 9.1   GFR Estimated Creatinine Clearance: 139.4 mL/min (by C-G formula based on SCr of 0.87 mg/dL). Liver Function Tests: Recent Labs  Lab 06/21/21 0838 06/22/21 0444 06/23/21 0500  AST 21 14* 20  ALT 40 26 27  ALKPHOS 67 62 78  BILITOT 0.7 0.9 0.5  PROT 8.7* 7.5 7.5  ALBUMIN 3.7 3.2* 3.0*   Recent Labs  Lab 06/21/21 0838  LIPASE 112*   No  results for input(s): AMMONIA in the last 168 hours. Coagulation profile No results for input(s): INR, PROTIME in the last 168 hours.  CBC: Recent Labs  Lab 06/21/21 0838 06/22/21 0444  WBC 8.9 6.6  NEUTROABS 6.3  --   HGB 14.4 12.9*  HCT 44.5 40.3  MCV 92.3 92.4  PLT 688* 473*   Cardiac Enzymes: No results for input(s): CKTOTAL, CKMB, CKMBINDEX, TROPONINI in the last 168 hours. BNP: Invalid input(s): POCBNP CBG: Recent Labs  Lab 06/22/21 1137 06/22/21 1646 06/22/21 2115 06/23/21 0759 06/23/21 1219  GLUCAP 162* 103* 193* 195* 186*   D-Dimer No results for input(s): DDIMER in the last 72 hours. Hgb A1c No results for input(s): HGBA1C in the last 72 hours. Lipid Profile No results for input(s): CHOL, HDL, LDLCALC, TRIG, CHOLHDL, LDLDIRECT in the last 72 hours. Thyroid function studies No results for input(s): TSH, T4TOTAL, T3FREE, THYROIDAB in the last 72 hours.  Invalid input(s): FREET3 Anemia work up No results for input(s): VITAMINB12, FOLATE, FERRITIN, TIBC, IRON, RETICCTPCT in the last 72 hours. Microbiology Recent Results (from the past 240 hour(s))  Resp Panel by RT-PCR (Flu A&B, Covid) Nasopharyngeal Swab     Status: None   Collection Time: 06/21/21  8:38 AM   Specimen: Nasopharyngeal Swab; Nasopharyngeal(NP) swabs in vial transport medium  Result Value Ref Range Status   SARS Coronavirus 2 by RT PCR  NEGATIVE NEGATIVE Final    Comment: (NOTE) SARS-CoV-2 target nucleic acids are NOT DETECTED.  The SARS-CoV-2 RNA is generally detectable in upper respiratory specimens during the acute phase of infection. The lowest concentration of SARS-CoV-2 viral copies this assay can detect is 138 copies/mL. A negative result does not preclude SARS-Cov-2 infection and should not be used as the sole basis for treatment or other patient management decisions. A negative result may occur with  improper specimen collection/handling, submission of specimen other than  nasopharyngeal swab, presence of viral mutation(s) within the areas targeted by this assay, and inadequate number of viral copies(<138 copies/mL). A negative result must be combined with clinical observations, patient history, and epidemiological information. The expected result is Negative.  Fact Sheet for Patients:  EntrepreneurPulse.com.au  Fact Sheet for Healthcare Providers:  IncredibleEmployment.be  This test is no t yet approved or cleared by the [place] FDA and  has been authorized for detection and/or diagnosis of SARS-CoV-2 by FDA under an Emergency Use Authorization (EUA). This EUA will remain  in effect (meaning this test can be used) for the duration of the COVID-19 declaration under Section 564(b)(1) of the Act, 21 U.S.C.section 360bbb-3(b)(1), unless the authorization is terminated  or revoked sooner.       Influenza A by PCR NEGATIVE NEGATIVE Final   Influenza B by PCR NEGATIVE NEGATIVE Final    Comment: (NOTE) The Xpert Xpress SARS-CoV-2/FLU/RSV plus assay is intended as an aid in the diagnosis of influenza from Nasopharyngeal swab specimens and should not be used as a sole basis for treatment. Nasal washings and aspirates are unacceptable for Xpert Xpress SARS-CoV-2/FLU/RSV testing.  Fact Sheet for Patients: EntrepreneurPulse.com.au  Fact Sheet for Healthcare Providers: IncredibleEmployment.be  This test is not yet approved or cleared by the [place] FDA and has been authorized for detection and/or diagnosis of SARS-CoV-2 by FDA under an Emergency Use Authorization (EUA). This EUA will remain in effect (meaning this test can be used) for the duration of the COVID-19 declaration under Section 564(b)(1) of the Act, 21 U.S.C. section 360bbb-3(b)(1), unless the authorization is terminated or revoked.  Performed at [facility], [place] [address] [place], [place] [address]      Discharge Instructions:   Discharge Instructions     Discharge instructions   Complete by: As directed    Use ibuprofen for pain and oxycodone if not relieved with ibuprofen  -if taking ibuprofen, also use OTC zantac/Prilosec Stay on liquid diet and then SLOWLY advance to low fat diet   Increase activity slowly   Complete by: As directed       Allergies as of 06/23/2021   No Known Allergies      Medication List     STOP taking these medications    atorvastatin 20 MG tablet Commonly known as: Lipitor       TAKE these medications    doxylamine (Sleep) 25 MG tablet Commonly known as: UNISOM Take 25 mg by mouth at bedtime as needed for sleep.   folic acid 1 MG tablet Commonly known as: FOLVITE Take 1 tablet (1 mg total) by mouth daily.   ibuprofen 600 MG tablet Commonly known as: ADVIL Take 1 tablet (600 mg total) by mouth every 6 (six) hours as needed for mild pain.   lisinopril 10 MG tablet Commonly known as: ZESTRIL Take 1 tablet (10 mg total) by mouth daily.   metFORMIN 500 MG tablet Commonly known as: Glucophage Take 1 tablet (500 mg total)  by mouth daily with breakfast.   NovoLOG Mix 70/30 FlexPen (70-30) 100 UNIT/ML FlexPen Generic drug: insulin aspart protamine - aspart Inject 15 Units into the skin 2 (two) times daily.   oxyCODONE 5 MG immediate release tablet Commonly known as: Oxy IR/ROXICODONE Take 1 tablet (5 mg total) by mouth every 6 (six) hours as needed for moderate pain.   thiamine 100 MG tablet Take 1 tablet (100 mg total) by mouth daily.   True Metrix Blood Glucose Test test strip Generic drug: glucose blood use up to 4 times daily as directed.   True Metrix Meter w/Device Kit use up to 4 times daily as directed   TRUEplus Lancets 28G Misc use up to 4 times daily as directed.   TRUEplus Pen Needles 31G X 5 MM Misc Generic drug: Insulin Pen Needle use as directed          Time  coordinating discharge: 35 min  Signed:  Geradine Girt DO  [facility] 06/23/2021, 4:43 PM

## 2021-06-23 NOTE — Progress Notes (Signed)
Went over discharge instructions w/ pt. Pt verbalized understanding.  

## 2021-07-09 ENCOUNTER — Other Ambulatory Visit: Payer: Self-pay

## 2021-07-09 ENCOUNTER — Ambulatory Visit (INDEPENDENT_AMBULATORY_CARE_PROVIDER_SITE_OTHER): Payer: 59 | Admitting: Nurse Practitioner

## 2021-07-09 ENCOUNTER — Encounter: Payer: Self-pay | Admitting: Nurse Practitioner

## 2021-07-09 VITALS — BP 136/89 | HR 99 | Temp 97.0°F | Ht 65.0 in | Wt 217.4 lb

## 2021-07-09 DIAGNOSIS — I10 Essential (primary) hypertension: Secondary | ICD-10-CM

## 2021-07-09 DIAGNOSIS — E1165 Type 2 diabetes mellitus with hyperglycemia: Secondary | ICD-10-CM | POA: Diagnosis not present

## 2021-07-09 DIAGNOSIS — Z7689 Persons encountering health services in other specified circumstances: Secondary | ICD-10-CM | POA: Diagnosis not present

## 2021-07-09 DIAGNOSIS — Z0000 Encounter for general adult medical examination without abnormal findings: Secondary | ICD-10-CM

## 2021-07-09 LAB — POCT URINALYSIS DIP (CLINITEK)
Blood, UA: NEGATIVE
Glucose, UA: NEGATIVE mg/dL
Leukocytes, UA: NEGATIVE
Nitrite, UA: NEGATIVE
POC PROTEIN,UA: 100 — AB
Spec Grav, UA: >=1.03 — AB (ref 1.010–1.025)
Urobilinogen, UA: 0.2 E.U./dL
pH, UA: 6.5 (ref 5.0–8.0)

## 2021-07-09 LAB — POCT GLYCOSYLATED HEMOGLOBIN (HGB A1C)
HbA1c POC (<> result, manual entry): 10.3 % (ref 4.0–5.6)
HbA1c, POC (controlled diabetic range): 10.3 % — AB (ref 0.0–7.0)
HbA1c, POC (prediabetic range): 10.3 % — AB (ref 5.7–6.4)
Hemoglobin A1C: 10.3 % — AB (ref 4.0–5.6)

## 2021-07-09 LAB — GLUCOSE, POCT (MANUAL RESULT ENTRY): POC Glucose: 152 mg/dl — AB (ref 70–99)

## 2021-07-09 MED ORDER — FOLIC ACID 1 MG PO TABS: 1.0000 mg | ORAL_TABLET | Freq: Every day | ORAL | 3 refills | Status: AC

## 2021-07-09 MED ORDER — METFORMIN HCL 500 MG PO TABS: 500.0000 mg | ORAL_TABLET | Freq: Every day | ORAL | 3 refills | Status: DC

## 2021-07-09 MED ORDER — NOVOLOG MIX 70/30 FLEXPEN (70-30) 100 UNIT/ML ~~LOC~~ SUPN: 15.0000 [IU] | PEN_INJECTOR | Freq: Two times a day (BID) | SUBCUTANEOUS | 3 refills | Status: DC

## 2021-07-09 MED ORDER — LISINOPRIL 10 MG PO TABS: 10.0000 mg | ORAL_TABLET | Freq: Every day | ORAL | 3 refills | Status: AC

## 2021-07-09 MED ORDER — THIAMINE HCL 100 MG PO TABS: 100.0000 mg | ORAL_TABLET | Freq: Every day | ORAL | 3 refills | Status: AC

## 2021-07-09 NOTE — Patient Instructions (Signed)
Health Maintenance, Male Adopting a healthy lifestyle and getting preventive care are important in promoting health and wellness. Ask your health care provider about: The right schedule for you to have regular tests and exams. Things you can do on your own to prevent diseases and keep yourself healthy. What should I know about diet, weight, and exercise? Eat a healthy diet  Eat a diet that includes plenty of vegetables, fruits, low-fat dairy products, and lean protein. Do not eat a lot of foods that are high in solid fats, added sugars, or sodium. Maintain a healthy weight Body mass index (BMI) is a measurement that can be used to identify possible weight problems. It estimates body fat based on height and weight. Your health care provider can help determine your BMI and help you achieve or maintain a healthy weight. Get regular exercise Get regular exercise. This is one of the most important things you can do for your health. Most adults should: Exercise for at least 150 minutes each week. The exercise should increase your heart rate and make you sweat (moderate-intensity exercise). Do strengthening exercises at least twice a week. This is in addition to the moderate-intensity exercise. Spend less time sitting. Even light physical activity can be beneficial. Watch cholesterol and blood lipids Have your blood tested for lipids and cholesterol at 34 years of age, then have this test every 5 years. You may need to have your cholesterol levels checked more often if: Your lipid or cholesterol levels are high. You are older than 34 years of age. You are at high risk for heart disease. What should I know about cancer screening? Many types of cancers can be detected early and may often be prevented. Depending on your health history and family history, you may need to have cancer screening at various ages. This may include screening for: Colorectal cancer. Prostate cancer. Skin cancer. Lung  cancer. What should I know about heart disease, diabetes, and high blood pressure? Blood pressure and heart disease High blood pressure causes heart disease and increases the risk of stroke. This is more likely to develop in people who have high blood pressure readings, are of African descent, or are overweight. Talk with your health care provider about your target blood pressure readings. Have your blood pressure checked: Every 3-5 years if you are 18-39 years of age. Every year if you are 40 years old or older. If you are between the ages of 65 and 75 and are a current or former smoker, ask your health care provider if you should have a one-time screening for abdominal aortic aneurysm (AAA). Diabetes Have regular diabetes screenings. This checks your fasting blood sugar level. Have the screening done: Once every three years after age 45 if you are at a normal weight and have a low risk for diabetes. More often and at a younger age if you are overweight or have a high risk for diabetes. What should I know about preventing infection? Hepatitis B If you have a higher risk for hepatitis B, you should be screened for this virus. Talk with your health care provider to find out if you are at risk for hepatitis B infection. Hepatitis C Blood testing is recommended for: Everyone born from 1945 through 1965. Anyone with known risk factors for hepatitis C. Sexually transmitted infections (STIs) You should be screened each year for STIs, including gonorrhea and chlamydia, if: You are sexually active and are younger than 34 years of age. You are older than 34 years   of age and your health care provider tells you that you are at risk for this type of infection. Your sexual activity has changed since you were last screened, and you are at increased risk for chlamydia or gonorrhea. Ask your health care provider if you are at risk. Ask your health care provider about whether you are at high risk for HIV.  Your health care provider may recommend a prescription medicine to help prevent HIV infection. If you choose to take medicine to prevent HIV, you should first get tested for HIV. You should then be tested every 3 months for as long as you are taking the medicine. Follow these instructions at home: Lifestyle Do not use any products that contain nicotine or tobacco, such as cigarettes, e-cigarettes, and chewing tobacco. If you need help quitting, ask your health care provider. Do not use street drugs. Do not share needles. Ask your health care provider for help if you need support or information about quitting drugs. Alcohol use Do not drink alcohol if your health care provider tells you not to drink. If you drink alcohol: Limit how much you have to 0-2 drinks a day. Be aware of how much alcohol is in your drink. In the [place] one drink equals one 12 oz bottle of beer (355 mL), one 5 oz glass of wine (148 mL), or one 1 oz glass of hard liquor (44 mL). General instructions Schedule regular health, dental, and eye exams. Stay current with your vaccines. Tell your health care provider if: You often feel depressed. You have ever been abused or do not feel safe at home. Summary Adopting a healthy lifestyle and getting preventive care are important in promoting health and wellness. Follow your health care provider's instructions about healthy diet, exercising, and getting tested or screened for diseases. Follow your health care provider's instructions on monitoring your cholesterol and blood pressure. This information is not intended to replace advice given to you by your health care provider. Make sure you discuss any questions you have with your health care provider. Document Revised: 12/11/2020 Document Reviewed: 09/26/2018 Elsevier Patient Education  2022 Else[employer] Inc[employer]  Diabetes Mellitus and Nutrition, Adult When you have diabetes, or diabetes mellitus, it is very important to have healthy  eating habits because your blood sugar (glucose) levels are greatly affected by what you eat and drink. Eating healthy foods in the right amounts, at about the same times every day, can help you: Control your blood glucose. Lower your risk of heart disease. Improve your blood pressure. Reach or maintain a healthy weight. What can affect my meal plan? Every person with diabetes is different, and each person has different needs for a meal plan. Your health care provider may recommend that you work with a dietitian to make a meal plan that is best for you. Your meal plan may vary depending on factors such as: The calories you need. The medicines you take. Your weight. Your blood glucose, blood pressure, and cholesterol levels. Your activity level. Other health conditions you have, such as heart or kidney disease. How do carbohydrates affect me? Carbohydrates, also called carbs, affect your blood glucose level more than any other type of food. Eating carbs naturally raises the amount of glucose in your blood. Carb counting is a method for keeping track of how many carbs you eat. Counting carbs is important to keep your blood glucose at a healthy level, especially if you use insulin or take certain oral diabetes medicines. It is important to  know how many carbs you can safely have in each meal. This is different for every person. Your dietitian can help you calculate how many carbs you should have at each meal and for each snack. How does alcohol affect me? Alcohol can cause a sudden decrease in blood glucose (hypoglycemia), especially if you use insulin or take certain oral diabetes medicines. Hypoglycemia can be a life-threatening condition. Symptoms of hypoglycemia, such as sleepiness, dizziness, and confusion, are similar to symptoms of having too much alcohol. Do not drink alcohol if: Your health care provider tells you not to drink. You are pregnant, may be pregnant, or are planning to become  pregnant. If you drink alcohol: Do not drink on an empty stomach. Limit how much you use to: 0-1 drink a day for women. 0-2 drinks a day for men. Be aware of how much alcohol is in your drink. In the [place] one drink equals one 12 oz bottle of beer (355 mL), one 5 oz glass of wine (148 mL), or one 1 oz glass of hard liquor (44 mL). Keep yourself hydrated with water, diet soda, or unsweetened iced tea. Keep in mind that regular soda, juice, and other mixers may contain a lot of sugar and must be counted as carbs. What are tips for following this plan? Reading food labels Start by checking the serving size on the "Nutrition Facts" label of packaged foods and drinks. The amount of calories, carbs, fats, and other nutrients listed on the label is based on one serving of the item. Many items contain more than one serving per package. Check the total grams (g) of carbs in one serving. You can calculate the number of servings of carbs in one serving by dividing the total carbs by 15. For example, if a food has 30 g of total carbs per serving, it would be equal to 2 servings of carbs. Check the number of grams (g) of saturated fats and trans fats in one serving. Choose foods that have a low amount or none of these fats. Check the number of milligrams (mg) of salt (sodium) in one serving. Most people should limit total sodium intake to less than 2,300 mg per day. Always check the nutrition information of foods labeled as "low-fat" or "nonfat." These foods may be higher in added sugar or refined carbs and should be avoided. Talk to your dietitian to identify your daily goals for nutrients listed on the label. Shopping Avoid buying canned, pre-made, or processed foods. These foods tend to be high in fat, sodium, and added sugar. Shop around the outside edge of the grocery store. This is where you will most often find fresh fruits and vegetables, bulk grains, fresh meats, and fresh dairy. Cooking Use  low-heat cooking methods, such as baking, instead of high-heat cooking methods like deep frying. Cook using healthy oils, such as olive, canola, or sunflower oil. Avoid cooking with butter, cream, or high-fat meats. Meal planning Eat meals and snacks regularly, preferably at the same times every day. Avoid going long periods of time without eating. Eat foods that are high in fiber, such as fresh fruits, vegetables, beans, and whole grains. Talk with your dietitian about how many servings of carbs you can eat at each meal. Eat 4-6 oz (112-168 g) of lean protein each day, such as lean meat, chicken, fish, eggs, or tofu. One ounce (oz) of lean protein is equal to: 1 oz (28 g) of meat, chicken, or fish. 1 egg.  cup (62 g)  of tofu. Eat some foods each day that contain healthy fats, such as avocado, nuts, seeds, and fish. What foods should I eat? Fruits Berries. Apples. Oranges. Peaches. Apricots. Plums. Grapes. Mango. Papaya. Pomegranate. Kiwi. Cherries. Vegetables Lettuce. Spinach. Leafy greens, including kale, chard, collard greens, and mustard greens. Beets. Cauliflower. Cabbage. Broccoli. Carrots. Green beans. Tomatoes. Peppers. Onions. Cucumbers. Brussels sprouts. Grains Whole grains, such as whole-wheat or whole-grain bread, crackers, tortillas, cereal, and pasta. Unsweetened oatmeal. Quinoa. Brown or wild rice. Meats and other proteins Seafood. Poultry without skin. Lean cuts of poultry and beef. Tofu. Nuts. Seeds. Dairy Low-fat or fat-free dairy products such as milk, yogurt, and cheese. The items listed above may not be a complete list of foods and beverages you can eat. Contact a dietitian for more information. What foods should I avoid? Fruits Fruits canned with syrup. Vegetables Canned vegetables. Frozen vegetables with butter or cream sauce. Grains Refined white flour and flour products such as bread, pasta, snack foods, and cereals. Avoid all processed foods. Meats and other  proteins Fatty cuts of meat. Poultry with skin. Breaded or fried meats. Processed meat. Avoid saturated fats. Dairy Full-fat yogurt, cheese, or milk. Beverages Sweetened drinks, such as soda or iced tea. The items listed above may not be a complete list of foods and beverages you should avoid. Contact a dietitian for more information. Questions to ask a health care provider Do I need to meet with a diabetes educator? Do I need to meet with a dietitian? What number can I call if I have questions? When are the best times to check my blood glucose? Where to find more information: American Diabetes Association: diabetes.org Academy of Nutrition and Dietetics: www.eatright.[employer] of Diabetes and Digestive and Kidney Diseases: CarF[employer].tn Association of Diabetes Care and Education Specialists: www.diabeteseducator.org Summary It is important to have healthy eating habits because your blood sugar (glucose) levels are greatly affected by what you eat and drink. A healthy meal plan will help you control your blood glucose and maintain a healthy lifestyle. Your health care provider may recommend that you work with a dietitian to make a meal plan that is best for you. Keep in mind that carbohydrates (carbs) and alcohol have immediate effects on your blood glucose levels. It is important to count carbs and to use alcohol carefully. This information is not intended to replace advice given to you by your health care provider. Make sure you discuss any questions you have with your health care provider. Document Revised: 09/10/2019 Document Reviewed: 09/10/2019 Elsevier Patient Education  2021 [employer].

## 2021-07-09 NOTE — Progress Notes (Signed)
[address] [place], [place]  [address] Phone:  [phone]   Fax:  [phone]   New Patient Office Visit  Subjective:  Patient ID: Edward Ware, male    DOB: January 12, 1986  Age: 35 y.o. MRN: [id number]  CC:  Chief Complaint  Patient presents with   Establish Care    Pt is here today to establish care. Pt state he is needing refills on his medications. Pt is a diabetic.     HPI Edward Ware presents to establish care. He  has a past medical history of Diabetes mellitus without complication ([place]) and Hypertension.   He was treated for pancreatitis in the hospital in August 2022 .  It was at that time that he was also diagnosed with diabetes.  He was started on insulin therapy NovoLog 70/30 along with metformin 500 mg daily.  He is in today needing refills of his medication.  He reports that he continues to have ongoing pain related to the pancreatitis.  The pain is on the left side of the abdomen.  He denies any nausea or vomiting.  Home sugars: BGs have been labile ranging between 120 and 395 .  He was also started on lisinopril 10 mg for his hypertension.  Past Medical History:  Diagnosis Date   Diabetes mellitus without complication ([place])    Hypertension     History reviewed. No pertinent surgical history.  Family History  Problem Relation Age of Onset   Diabetes Mother    Hypertension Mother    Diabetes Other     Social History   Socioeconomic History   Marital status: Single    Spouse name: Not on file   Number of children: Not on file   Years of education: Not on file   Highest education level: Not on file  Occupational History   Not on file  Tobacco Use   Smoking status: Former   Smokeless tobacco: Never  Vaping Use   Vaping Use: Every day   Substances: Nicotine, Flavoring  Substance and Sexual Activity   Alcohol use: Not Currently    Comment: 1 pint a night of alcohol   Drug use: No   Sexual activity: Yes   Other Topics Concern   Not on file  Social History Narrative   Not on file   Social Determinants of Health   Financial Resource Strain: Not on file  Food Insecurity: Not on file  Transportation Needs: Not on file  Physical Activity: Not on file  Stress: Not on file  Social Connections: Not on file  Intimate Partner Violence: Not on file    ROS Review of Systems  Objective:   Today's Vitals: BP 136/89   Pulse 99   Temp (!) 97 F (36.1 C)   Ht _0  (1.651 m)   Wt 217 lb 6.4 oz (98.6 kg)   SpO2 100%   BMI 36.18 kg/m   Physical Exam Constitutional:      General: He is not in acute distress.    Appearance: He is obese. He is not ill-appearing, toxic-appearing or diaphoretic.  HENT:     Head: Normocephalic and atraumatic.     Right Ear: Tympanic membrane normal.     Left Ear: Tympanic membrane normal.     Nose: Nose normal.     Mouth/Throat:     Mouth: Mucous membranes are moist.  Cardiovascular:     Rate and Rhythm: Normal rate and regular rhythm.  Pulses: Normal pulses.     Heart sounds: Normal heart sounds.  Pulmonary:     Effort: Pulmonary effort is normal.     Breath sounds: Normal breath sounds.  Abdominal:     General: Bowel sounds are normal.     Palpations: Abdomen is soft. There is no mass.     Tenderness: There is abdominal tenderness.     Hernia: No hernia is present.  Musculoskeletal:        General: Normal range of motion.     Cervical back: Normal range of motion.  Skin:    General: Skin is warm and dry.     Capillary Refill: Capillary refill takes less than 2 seconds.  Neurological:     General: No focal deficit present.     Mental Status: He is alert and oriented to person, place, and time.  Psychiatric:        Mood and Affect: Mood normal.        Thought Content: Thought content normal.        Judgment: Judgment normal.    Assessment & Plan:   Problem List Items Addressed This Visit       Cardiovascular and Mediastinum    Essential hypertension Encouraged on going compliance with current medication regimen Encouraged home monitoring and recording BP <130/80 Eating a heart-healthy diet with less salt Encouraged regular physical activity  Recommend Weight loss     Relevant Medications   lisinopril (ZESTRIL) 10 MG tablet   Other Visit Diagnoses     Encounter to establish care    -  Primary Discussed male health maintenance; self exams of breast and testicles  and annual exams. Discussed prostate age related concerns Discussed general safety in vehicle and COVID Discussed regular hydration with water Discussed healthy diet and exercise and weight management Discussed sexual health  Discussed mental health Encouraged to call our office for an appointment with in ongoing concerns for questions.       Healthcare maintenance       Relevant Orders   POCT URINALYSIS DIP (CLINITEK) (Completed)   Glucose (CBG) (Completed)   HgB A1c (Completed)   Uncontrolled type 2 diabetes mellitus with hyperglycemia (HCC)     Persistent tolerating current treatment without difficulty Encourage compliance with current treatment regimen  Dose adjustment none Encourage regular CBG monitoring Encourage contacting office if excessive hyperglycemia and or hypoglycemia Lifestyle modification with healthy diet (fewer calories, more high fiber foods, whole grains and non-starchy vegetables, lower fat meat and fish, low-fat diary include healthy oils) regular exercise (physical activity) and weight loss Opthalmology exam discussed  Nutritional consult recommended Regular dental visits encouraged Home BP monitoring also encouraged goal <130/80 Discussed starting statin in future appointments     Relevant Medications   insulin aspart protamine - aspart (NOVOLOG MIX 70/30 FLEXPEN) (70-30) 100 UNIT/ML FlexPen   lisinopril (ZESTRIL) 10 MG tablet   metFORMIN (GLUCOPHAGE) 500 MG tablet       Outpatient Encounter Medications as  of 07/09/2021  Medication Sig   Blood Glucose Monitoring Suppl (TRUE METRIX METER) w/Device KIT use up to 4 times daily as directed   doxylamine, Sleep, (UNISOM) 25 MG tablet Take 25 mg by mouth at bedtime as needed for sleep.   glucose blood test strip use up to 4 times daily as directed.   ibuprofen (ADVIL) 600 MG tablet Take 1 tablet (600 mg total) by mouth every 6 (six) hours as needed for mild pain.   Insulin Pen Needle 31G  X 5 MM MISC use as directed   oxyCODONE (OXY IR/ROXICODONE) 5 MG immediate release tablet Take 1 tablet (5 mg total) by mouth every 6 (six) hours as needed for moderate pain.   TRUEplus Lancets 28G MISC use up to 4 times daily as directed.   [DISCONTINUED] folic acid (FOLVITE) 1 MG tablet Take 1 tablet (1 mg total) by mouth daily.   [DISCONTINUED] insulin aspart protamine - aspart (NOVOLOG MIX 70/30 FLEXPEN) (70-30) 100 UNIT/ML FlexPen Inject 15 Units into the skin 2 (two) times daily.   [DISCONTINUED] lisinopril (ZESTRIL) 10 MG tablet Take 1 tablet (10 mg total) by mouth daily.   [DISCONTINUED] metFORMIN (GLUCOPHAGE) 500 MG tablet Take 1 tablet (500 mg total) by mouth daily with breakfast.   folic acid (FOLVITE) 1 MG tablet Take 1 tablet (1 mg total) by mouth daily.   insulin aspart protamine - aspart (NOVOLOG MIX 70/30 FLEXPEN) (70-30) 100 UNIT/ML FlexPen Inject 15 Units into the skin 2 (two) times daily.   lisinopril (ZESTRIL) 10 MG tablet Take 1 tablet (10 mg total) by mouth daily.   metFORMIN (GLUCOPHAGE) 500 MG tablet Take 1 tablet (500 mg total) by mouth daily with breakfast.   thiamine 100 MG tablet Take 1 tablet (100 mg total) by mouth daily.   [DISCONTINUED] thiamine 100 MG tablet Take 1 tablet (100 mg total) by mouth daily. (Patient not taking: Reported on 07/09/2021)   No facility-administered encounter medications on file as of 07/09/2021.    Follow-up: Return in about 6 weeks (around 08/20/2021) for follow up [id number].   Vevelyn Francois, NP

## 2021-08-20 ENCOUNTER — Ambulatory Visit: Payer: 59 | Admitting: Nurse Practitioner

## 2021-09-21 ENCOUNTER — Encounter (HOSPITAL_COMMUNITY): Payer: Self-pay | Admitting: Emergency Medicine

## 2021-09-21 ENCOUNTER — Other Ambulatory Visit: Payer: Self-pay

## 2021-09-21 ENCOUNTER — Ambulatory Visit (HOSPITAL_COMMUNITY)
Admission: EM | Admit: 2021-09-21 | Discharge: 2021-09-21 | Disposition: A | Discharge status: Home / Self Care | Payer: 59 | Attending: Emergency Medicine | Admitting: Emergency Medicine

## 2021-09-21 DIAGNOSIS — B349 Viral infection, unspecified: Secondary | ICD-10-CM

## 2021-09-21 MED ORDER — PROMETHAZINE-DM 6.25-15 MG/5ML PO SYRP: 5.0000 mL | ORAL_SOLUTION | Freq: Four times a day (QID) | ORAL | 0 refills | Status: DC | PRN

## 2021-09-21 MED ORDER — BENZONATATE 100 MG PO CAPS: 100.0000 mg | ORAL_CAPSULE | Freq: Three times a day (TID) | ORAL | 0 refills | Status: DC

## 2021-09-21 MED ORDER — LOPERAMIDE HCL 2 MG PO CAPS: 2.0000 mg | ORAL_CAPSULE | Freq: Four times a day (QID) | ORAL | 0 refills | Status: DC | PRN

## 2021-09-21 NOTE — ED Triage Notes (Signed)
Pt c/o cough, body aches, and fevers for a couple days. Wants to make sure doesn't have RSV.

## 2021-09-21 NOTE — Discharge Instructions (Signed)
Your symptoms today are most likely being caused by a virus and should steadily improve in time it can take up to 7 to 10 days before you truly start to see a turnaround however things will get better  You may take cough syrup every 6 hours as needed to help with nausea and with coughing, be mindful of this medication may make you drowsy  You may use Tessalon pill every 8 hours to help calm your coughing  You may use Imodium to help slow down your diarrhea, be mindful over use of this medication may have the opposite effect of constipation    You can take Tylenol and/or Ibuprofen as needed for fever reduction and pain relief.   For cough: honey 1/2 to 1 teaspoon (you can dilute the honey in water or another fluid). You can use a humidifier for chest congestion and cough.  If you don't have a humidifier, you can sit in the bathroom with the hot shower running.      For sore throat: try warm salt water gargles, cepacol lozenges, throat spray, warm tea or water with lemon/honey, popsicles or ice, or OTC cold relief medicine for throat discomfort.   For congestion: take a daily anti-histamine like Zyrtec, Claritin, and a oral decongestant, such as pseudoephedrine.  You can also use Flonase 1-2 sprays in each nostril daily.   It is important to stay hydrated: drink plenty of fluids (water, gatorade/powerade/pedialyte, juices, or teas) to keep your throat moisturized and help further relieve irritation/discomfort.

## 2021-09-21 NOTE — ED Provider Notes (Signed)
Edward Ware    CSN: [id number] Arrival date & time: 09/21/21  0946      History   Chief Complaint Chief Complaint  Patient presents with   Cough   Fever    HPI Edward Ware is a 35 y.o. male.   Patient presents with chills, fevers, rhinorrhea, productive cough, intermittent generalized abdominal pain, nausea with vomiting and diarrhea for 5 days.  Last episode of diarrhea and vomiting 1 day ago.  Known sick contacts for RSV. Attempted use of advil, sleeping aids, somewhat helpful. History of DM, HTN, ETOH abuse.   Past Medical History:  Diagnosis Date   Diabetes mellitus without complication ([place])    Hypertension     Patient Active Problem List   Diagnosis Date Noted   Abdominal pain 06/22/2021   Splenic vein thrombosis    Acute pancreatitis 06/12/2021   Hepatic steatosis 06/12/2021   Essential hypertension 06/12/2021   T2DM (type 2 diabetes mellitus) ([place]) 06/12/2021   Obesity (BMI 30-39.9) 06/12/2021   DKA (diabetic ketoacidosis) ([place]) 06/03/2021   ETOH abuse 06/03/2021   Tobacco abuse 06/03/2021   Pancreatitis 06/03/2021   Dehydration 06/03/2021    History reviewed. No pertinent surgical history.     Home Medications    Prior to Admission medications   Medication Sig Start Date End Date Taking? Authorizing Provider  Blood Glucose Monitoring Suppl (TRUE METRIX METER) w/Device KIT use up to 4 times daily as directed 06/08/21   Oswald Hillock, MD  doxylamine, Sleep, (UNISOM) 25 MG tablet Take 25 mg by mouth at bedtime as needed for sleep.    [provider]  folic acid (FOLVITE) 1 MG tablet Take 1 tablet (1 mg total) by mouth daily. 07/09/21 07/09/22  Vevelyn Francois, NP  glucose blood test strip use up to 4 times daily as directed. 06/08/21   Oswald Hillock, MD  ibuprofen (ADVIL) 600 MG tablet Take 1 tablet (600 mg total) by mouth every 6 (six) hours as needed for mild pain. 06/23/21   Geradine Girt, DO  insulin aspart protamine - aspart  (NOVOLOG MIX 70/30 FLEXPEN) (70-30) 100 UNIT/ML FlexPen Inject 15 Units into the skin 2 (two) times daily. 07/09/21 07/09/22  Vevelyn Francois, NP  Insulin Pen Needle 31G X 5 MM MISC use as directed 06/08/21   Oswald Hillock, MD  lisinopril (ZESTRIL) 10 MG tablet Take 1 tablet (10 mg total) by mouth daily. 07/09/21 07/09/22  Vevelyn Francois, NP  metFORMIN (GLUCOPHAGE) 500 MG tablet Take 1 tablet (500 mg total) by mouth daily with breakfast. 07/09/21 07/09/22  Vevelyn Francois, NP  oxyCODONE (OXY IR/ROXICODONE) 5 MG immediate release tablet Take 1 tablet (5 mg total) by mouth every 6 (six) hours as needed for moderate pain. 06/23/21   Geradine Girt, DO  thiamine 100 MG tablet Take 1 tablet (100 mg total) by mouth daily. 07/09/21 07/09/22  Vevelyn Francois, NP  TRUEplus Lancets 28G MISC use up to 4 times daily as directed. 06/08/21   Oswald Hillock, MD    Family History Family History  Problem Relation Age of Onset   Diabetes Mother    Hypertension Mother    Diabetes Other     Social History Social History   Tobacco Use   Smoking status: Former   Smokeless tobacco: Never  Scientific laboratory technician Use: Every day   Substances: Nicotine, Flavoring  Substance Use Topics   Alcohol use: Not Currently  Comment: 1 pint a night of alcohol   Drug use: No     Allergies   Patient has no known allergies.   Review of Systems Review of Systems  Constitutional:  Positive for chills and fever. Negative for activity change, appetite change, diaphoresis, fatigue and unexpected weight change.  HENT:  Positive for rhinorrhea. Negative for congestion, dental problem, drooling, ear discharge, facial swelling, hearing loss, mouth sores, nosebleeds, postnasal drip, sinus pressure, sinus pain, sneezing, sore throat, tinnitus, trouble swallowing and voice change.   Respiratory:  Positive for cough. Negative for apnea, choking, chest tightness, shortness of breath, wheezing and stridor.   Cardiovascular: Negative.    Gastrointestinal:  Positive for abdominal pain, diarrhea, nausea and vomiting. Negative for abdominal distention, anal bleeding, blood in stool, constipation and rectal pain.  Skin: Negative.   Neurological: Negative.     Physical Exam Triage Vital Signs ED Triage Vitals  Enc Vitals Group     BP 09/21/21 1110 121/79     Pulse Rate 09/21/21 1110 (!) 102     Resp 09/21/21 1110 17     Temp 09/21/21 1110 98.1 F (36.7 C)     Temp Source 09/21/21 1110 Oral     SpO2 09/21/21 1110 96 %     Weight --      Height --      Head Circumference --      Peak Flow --      Pain Score 09/21/21 1108 6     Pain Loc --      Pain Edu? --      Excl. in [place]? --    No data found.  Updated Vital Signs BP 121/79 (BP Location: Left Arm)   Pulse (!) 102   Temp 98.1 F (36.7 C) (Oral)   Resp 17   SpO2 96%   Visual Acuity Right Eye Distance:   Left Eye Distance:   Bilateral Distance:    Right Eye Near:   Left Eye Near:    Bilateral Near:     Physical Exam Constitutional:      Appearance: Normal appearance. He is normal weight.  HENT:     Head: Normocephalic.     Right Ear: Tympanic membrane, ear canal and external ear normal.     Left Ear: Tympanic membrane, ear canal and external ear normal.     Nose: Congestion present. No rhinorrhea.     Mouth/Throat:     Mouth: Mucous membranes are moist.     Pharynx: Oropharynx is clear.  Eyes:     Extraocular Movements: Extraocular movements intact.  Cardiovascular:     Rate and Rhythm: Normal rate and regular rhythm.     Pulses: Normal pulses.     Heart sounds: Normal heart sounds.  Pulmonary:     Effort: Pulmonary effort is normal.     Breath sounds: Normal breath sounds.  Abdominal:     General: Abdomen is flat. Bowel sounds are normal.     Palpations: Abdomen is soft.     Tenderness: There is no abdominal tenderness.  Musculoskeletal:        General: Normal range of motion.     Cervical back: Normal range of motion and neck supple.   Skin:    General: Skin is warm.  Neurological:     Mental Status: He is alert and oriented to person, place, and time. Mental status is at baseline.  Psychiatric:        Mood and Affect: Mood normal.  Behavior: Behavior normal.     UC Treatments / Results  Labs (all labs ordered are listed, but only abnormal results are displayed) Labs Reviewed - No data to display  EKG   Radiology No results found.  Procedures Procedures (including critical care time)  Medications Ordered in UC Medications - No data to display  Initial Impression / Assessment and Plan / UC Course  I have reviewed the triage vital signs and the nursing notes.  Pertinent labs & imaging results that were available during my care of the patient were reviewed by me and considered in my medical decision making (see chart for details).  Viral illness  Will defer testing at this time as it will not change the plan of care, discussed with patient, in agreement  1.  Zofran 4 mg every 6 hours as needed, advised increase fluid intake with water and electrolyte replacement substances such as Gatorade or similar product to prevent dehydration 2.  Imodium 2 mg 4 times daily as needed 3.  Promethazine DM 6.25-15 mg / 5 mL every 6 hours as needed 4.  Urgent care follow-up as needed 5.  Patient given note to give to parole officer that he was seen in urgent care today Final Clinical Impressions(s) / UC Diagnoses   Final diagnoses:  None   Discharge Instructions   None    ED Prescriptions   None    PDMP not reviewed this encounter.   Hans Eden, NP 09/21/21 [phone]

## 2021-11-12 ENCOUNTER — Emergency Department (HOSPITAL_COMMUNITY)
Admission: EM | Admit: 2021-11-12 | Discharge: 2021-11-12 | Disposition: A | Discharge status: Home / Self Care | Payer: Self-pay | Attending: Emergency Medicine | Admitting: Emergency Medicine

## 2021-11-12 ENCOUNTER — Other Ambulatory Visit: Payer: Self-pay

## 2021-11-12 ENCOUNTER — Emergency Department (HOSPITAL_COMMUNITY): Payer: Self-pay

## 2021-11-12 DIAGNOSIS — R739 Hyperglycemia, unspecified: Secondary | ICD-10-CM | POA: Insufficient documentation

## 2021-11-12 DIAGNOSIS — K859 Acute pancreatitis without necrosis or infection, unspecified: Secondary | ICD-10-CM | POA: Insufficient documentation

## 2021-11-12 DIAGNOSIS — Z79899 Other long term (current) drug therapy: Secondary | ICD-10-CM | POA: Insufficient documentation

## 2021-11-12 DIAGNOSIS — R1011 Right upper quadrant pain: Secondary | ICD-10-CM

## 2021-11-12 DIAGNOSIS — Z7984 Long term (current) use of oral hypoglycemic drugs: Secondary | ICD-10-CM | POA: Insufficient documentation

## 2021-11-12 DIAGNOSIS — K861 Other chronic pancreatitis: Secondary | ICD-10-CM

## 2021-11-12 DIAGNOSIS — Z794 Long term (current) use of insulin: Secondary | ICD-10-CM | POA: Insufficient documentation

## 2021-11-12 LAB — BLOOD GAS, VENOUS
Acid-Base Excess: 1.8 mmol/L (ref 0.0–2.0)
Bicarbonate: 27.7 mmol/L (ref 20.0–28.0)
O2 Saturation: 22.2 %
Patient temperature: 98.6
pCO2, Ven: 50.1 mmHg (ref 44.0–60.0)
pH, Ven: 7.362 (ref 7.250–7.430)
pO2, Ven: <31 mmHg — CL (ref 32.0–45.0)

## 2021-11-12 LAB — URINALYSIS, ROUTINE W REFLEX MICROSCOPIC
Bacteria, UA: NONE SEEN
Bilirubin Urine: NEGATIVE
Glucose, UA: >=500 mg/dL — AB
Ketones, ur: 20 mg/dL — AB
Leukocytes,Ua: NEGATIVE
Nitrite: NEGATIVE
Protein, ur: 100 mg/dL — AB
Specific Gravity, Urine: 1.031 — ABNORMAL HIGH (ref 1.005–1.030)
pH: 5 (ref 5.0–8.0)

## 2021-11-12 LAB — CBC WITH DIFFERENTIAL/PLATELET
Abs Immature Granulocytes: 0.04 10*3/uL (ref 0.00–0.07)
Basophils Absolute: 0 10*3/uL (ref 0.0–0.1)
Basophils Relative: 0 %
Eosinophils Absolute: 0.1 10*3/uL (ref 0.0–0.5)
Eosinophils Relative: 2 %
HCT: 47.6 % (ref 39.0–52.0)
Hemoglobin: 15.7 g/dL (ref 13.0–17.0)
Immature Granulocytes: 1 %
Lymphocytes Relative: 26 %
Lymphs Abs: 1.7 10*3/uL (ref 0.7–4.0)
MCH: 29.7 pg (ref 26.0–34.0)
MCHC: 33 g/dL (ref 30.0–36.0)
MCV: 90.2 fL (ref 80.0–100.0)
Monocytes Absolute: 0.6 10*3/uL (ref 0.1–1.0)
Monocytes Relative: 9 %
Neutro Abs: 4.1 10*3/uL (ref 1.7–7.7)
Neutrophils Relative %: 62 %
Platelets: 261 10*3/uL (ref 150–400)
RBC: 5.28 MIL/uL (ref 4.22–5.81)
RDW: 14.4 % (ref 11.5–15.5)
WBC: 6.5 10*3/uL (ref 4.0–10.5)
nRBC: 0 % (ref 0.0–0.2)

## 2021-11-12 LAB — CBG MONITORING, ED
Glucose-Capillary: 452 mg/dL — ABNORMAL HIGH (ref 70–99)
Glucose-Capillary: 396 mg/dL — ABNORMAL HIGH (ref 70–99)
Glucose-Capillary: 280 mg/dL — ABNORMAL HIGH (ref 70–99)
Glucose-Capillary: 303 mg/dL — ABNORMAL HIGH (ref 70–99)

## 2021-11-12 LAB — COMPREHENSIVE METABOLIC PANEL
ALT: 69 U/L — ABNORMAL HIGH (ref 0–44)
AST: 50 U/L — ABNORMAL HIGH (ref 15–41)
Albumin: 4.7 g/dL (ref 3.5–5.0)
Alkaline Phosphatase: 60 U/L (ref 38–126)
Anion gap: 11 (ref 5–15)
BUN: 14 mg/dL (ref 6–20)
CO2: 25 mmol/L (ref 22–32)
Calcium: 9.7 mg/dL (ref 8.9–10.3)
Chloride: 95 mmol/L — ABNORMAL LOW (ref 98–111)
Creatinine, Ser: 0.98 mg/dL (ref 0.61–1.24)
GFR, Estimated: >60 mL/min (ref >60)
Glucose, Bld: 383 mg/dL — ABNORMAL HIGH (ref 70–99)
Potassium: 5 mmol/L (ref 3.5–5.1)
Sodium: 131 mmol/L — ABNORMAL LOW (ref 135–145)
Total Bilirubin: 0.7 mg/dL (ref 0.3–1.2)
Total Protein: 8.5 g/dL — ABNORMAL HIGH (ref 6.5–8.1)

## 2021-11-12 LAB — BETA-HYDROXYBUTYRIC ACID: Beta-Hydroxybutyric Acid: 0.61 mmol/L — ABNORMAL HIGH (ref 0.05–0.27)

## 2021-11-12 LAB — LIPASE, BLOOD: Lipase: 93 U/L — ABNORMAL HIGH (ref 11–51)

## 2021-11-12 MED ORDER — OXYCODONE-ACETAMINOPHEN 5-325 MG PO TABS
1.0000 | ORAL_TABLET | Freq: Once | ORAL | Status: AC
  Administered 2021-11-12: 1 via ORAL
  Filled 2021-11-12: qty 1

## 2021-11-12 MED ORDER — OXYCODONE HCL 5 MG PO TABS: 5.0000 mg | ORAL_TABLET | ORAL | 0 refills | Status: DC | PRN

## 2021-11-12 MED ORDER — LACTATED RINGERS IV BOLUS
1000.0000 mL | Freq: Once | INTRAVENOUS | Status: AC
  Administered 2021-11-12: 1000 mL via INTRAVENOUS

## 2021-11-12 MED ORDER — HYDROMORPHONE HCL 1 MG/ML IJ SOLN
1.0000 mg | Freq: Once | INTRAMUSCULAR | Status: AC
  Administered 2021-11-12: 1 mg via INTRAVENOUS
  Filled 2021-11-12: qty 1

## 2021-11-12 NOTE — ED Provider Notes (Signed)
[facility] DEPT Provider Note   CSN: [id number] Arrival date & time: 11/12/21  1215     History  Chief Complaint  Patient presents with   Abdominal Pain    Edward Ware is a 36 y.o. male.  HPI 36 year old male presents with abdominal pain.  This is similar to prior pancreatitis.  He states that his blood sugar has been elevated including around 600 last night.  Significant other who is at the bedside also endorses that while he is usually on insulin 15 units twice daily, she gave him 18 units last night.  He is also been on metformin.  He has started back drinking since Christmas of 2022.  He thinks this is the cause.  No fevers.  He has chronic diarrhea that is unchanged.  He rates his pain as severe in his right upper quadrant and epigastrium which is typical to prior flares of his pancreatitis.  Home Medications Prior to Admission medications   Medication Sig Start Date End Date Taking? Authorizing Provider  oxyCODONE (ROXICODONE) 5 MG immediate release tablet Take 1 tablet (5 mg total) by mouth every 4 (four) hours as needed for severe pain. 11/12/21  Yes Sherwood Gambler, MD  benzonatate (TESSALON) 100 MG capsule Take 1 capsule (100 mg total) by mouth every 8 (eight) hours. 09/21/21   Hans Eden, NP  Blood Glucose Monitoring Suppl (TRUE METRIX METER) w/Device KIT use up to 4 times daily as directed 06/08/21   Oswald Hillock, MD  doxylamine, Sleep, (UNISOM) 25 MG tablet Take 25 mg by mouth at bedtime as needed for sleep.    [provider]  folic acid (FOLVITE) 1 MG tablet Take 1 tablet (1 mg total) by mouth daily. 07/09/21 07/09/22  Vevelyn Francois, NP  glucose blood test strip use up to 4 times daily as directed. 06/08/21   Oswald Hillock, MD  ibuprofen (ADVIL) 600 MG tablet Take 1 tablet (600 mg total) by mouth every 6 (six) hours as needed for mild pain. 06/23/21   Geradine Girt, DO  insulin aspart protamine - aspart (NOVOLOG MIX 70/30  FLEXPEN) (70-30) 100 UNIT/ML FlexPen Inject 15 Units into the skin 2 (two) times daily. 07/09/21 07/09/22  Vevelyn Francois, NP  Insulin Pen Needle 31G X 5 MM MISC use as directed 06/08/21   Oswald Hillock, MD  lisinopril (ZESTRIL) 10 MG tablet Take 1 tablet (10 mg total) by mouth daily. 07/09/21 07/09/22  Vevelyn Francois, NP  loperamide (IMODIUM) 2 MG capsule Take 1 capsule (2 mg total) by mouth 4 (four) times daily as needed for diarrhea or loose stools. 09/21/21   Hans Eden, NP  metFORMIN (GLUCOPHAGE) 500 MG tablet Take 1 tablet (500 mg total) by mouth daily with breakfast. 07/09/21 07/09/22  Vevelyn Francois, NP  promethazine-dextromethorphan (PROMETHAZINE-DM) 6.25-15 MG/5ML syrup Take 5 mLs by mouth 4 (four) times daily as needed for cough. 09/21/21   Hans Eden, NP  thiamine 100 MG tablet Take 1 tablet (100 mg total) by mouth daily. 07/09/21 07/09/22  Vevelyn Francois, NP  TRUEplus Lancets 28G MISC use up to 4 times daily as directed. 06/08/21   Oswald Hillock, MD      Allergies    Patient has no known allergies.    Review of Systems   Review of Systems  Constitutional:  Negative for fever.  Gastrointestinal:  Positive for abdominal pain and diarrhea (chronic). Negative for vomiting.   Physical  Exam Updated Vital Signs BP (!) 150/98 (BP Location: Right Arm)    Pulse 60    Temp 98 F (36.7 C) (Oral)    Resp 18    SpO2 98%  Physical Exam Vitals and nursing note reviewed.  Constitutional:      General: He is not in acute distress.    Appearance: He is well-developed. He is not ill-appearing or diaphoretic.  HENT:     Head: Normocephalic and atraumatic.  Cardiovascular:     Rate and Rhythm: Normal rate and regular rhythm.     Heart sounds: Normal heart sounds.  Pulmonary:     Effort: Pulmonary effort is normal.     Breath sounds: Normal breath sounds.  Abdominal:     Palpations: Abdomen is soft.     Tenderness: There is abdominal tenderness in the right upper quadrant and  epigastric area.  Skin:    General: Skin is warm and dry.  Neurological:     Mental Status: He is alert.    ED Results / Procedures / Treatments   Labs (all labs ordered are listed, but only abnormal results are displayed) Labs Reviewed  COMPREHENSIVE METABOLIC PANEL - Abnormal; Notable for the following components:      Result Value   Sodium 131 (*)    Chloride 95 (*)    Glucose, Bld 383 (*)    Total Protein 8.5 (*)    AST 50 (*)    ALT 69 (*)    All other components within normal limits  LIPASE, BLOOD - Abnormal; Notable for the following components:   Lipase 93 (*)    All other components within normal limits  URINALYSIS, ROUTINE W REFLEX MICROSCOPIC - Abnormal; Notable for the following components:   Specific Gravity, Urine 1.031 (*)    Glucose, UA >=500 (*)    Hgb urine dipstick MODERATE (*)    Ketones, ur 20 (*)    Protein, ur 100 (*)    All other components within normal limits  BETA-HYDROXYBUTYRIC ACID - Abnormal; Notable for the following components:   Beta-Hydroxybutyric Acid 0.61 (*)    All other components within normal limits  BLOOD GAS, VENOUS - Abnormal; Notable for the following components:   pO2, Ven <31.0 (*)    All other components within normal limits  CBG MONITORING, ED - Abnormal; Notable for the following components:   Glucose-Capillary 452 (*)    All other components within normal limits  CBG MONITORING, ED - Abnormal; Notable for the following components:   Glucose-Capillary 396 (*)    All other components within normal limits  CBG MONITORING, ED - Abnormal; Notable for the following components:   Glucose-Capillary 280 (*)    All other components within normal limits  CBG MONITORING, ED - Abnormal; Notable for the following components:   Glucose-Capillary 303 (*)    All other components within normal limits  CBC WITH DIFFERENTIAL/PLATELET    EKG None  Radiology US Abdomen Limited RUQ (LIVER/GB)  Result Date: 11/12/2021 CLINICAL DATA:   A 36 year old male presents for evaluation of RIGHT upper quadrant pain. EXAM: ULTRASOUND ABDOMEN LIMITED RIGHT UPPER QUADRANT COMPARISON:  September of 2022. FINDINGS: Gallbladder: No gallstones or gallbladder wall thickening. No pericholecystic stranding. Common bile duct: Diameter: 6.2 mm Liver: Marked increased echogenicity and heterogeneous echotexture of the liver. This limits assessment of deeper structures. No gross lesion in the liver though with very limited assessment. Portal vein appears patent with normal direction of flow on image 13 but shows  very limited assessment, some images presented do not show substantial flow within the portal vein, this is felt to be more likely related to technical factors. Other: No ascites IMPRESSION: Marked hepatic steatosis and heterogeneous echotexture raising the question of liver disease. Top-normal caliber of the common bile duct, with decompressed gallbladder, no signs of acute cholecystitis. Given top-normal caliber of the common bile duct would consider correlation with biliary enzymes and with MRI/MRCP as warranted. Limited assessment of deeper structures due to the degree of hepatic steatosis including the portal vein. Particularly if there is ongoing pancreatitis cross-sectional imaging could also be helpful to evaluate portal venous or splenic venous patency. Electronically Signed   By: Zetta Bills M.D.   On: 11/12/2021 15:35    Procedures Procedures    Medications Ordered in ED Medications  oxyCODONE-acetaminophen (PERCOCET/ROXICET) 5-325 MG per tablet 1 tablet (1 tablet Oral Given 11/12/21 1525)  lactated ringers bolus 1,000 mL (0 mLs Intravenous Stopped 11/12/21 2218)  HYDROmorphone (DILAUDID) injection 1 mg (1 mg Intravenous Given 11/12/21 2118)    ED Course/ Medical Decision Making/ A&P                           Medical Decision Making Risk Prescription drug management.   Patient presents with recurrent abdominal pain consistent with  pancreatitis due to alcohol abuse.  Patient has significant hyperglycemia here though it is improving.  His labs otherwise show a mild hyponatremia and mild AST/ALT elevation but normal bilirubin.  His lipase is mildly elevated.  He has some small ketones in his blood work on the beta hydroxybutyrate but his pH is normal and anion gap is normal so I highly doubt DKA.  He is feeling better after being given IV Dilaudid for pain.  He was given a bolus of fluids.  I discussed that he needs to stop using alcohol altogether as this is contributing to his pancreatitis.  Based on exam and presentation I do not think CT scan is warranted at this time.  I have reviewed his most recent PCP note where it seems that he has has been dealing with uncontrolled hyperglycemia in the past.  Discussed he will need to follow-up closely with PCP for further insulin changes.  Will give a short course of oxycodone and we discussed using a clear liquid diet.  He otherwise appears stable for discharge home with return precautions.  There is no vomiting.        Final Clinical Impression(s) / ED Diagnoses Final diagnoses:  RUQ abdominal pain  Acute on chronic pancreatitis ([place])  Hyperglycemia    Rx / DC Orders ED Discharge Orders          Ordered    oxyCODONE (ROXICODONE) 5 MG immediate release tablet  Every 4 hours PRN        11/12/21 2156              Sherwood Gambler, MD 11/12/21 2307

## 2021-11-12 NOTE — Discharge Instructions (Addendum)
Use a liquid diet until you have noted improvement in your abdominal pain, at least for 48 hours.  It is very important to stop drinking alcohol as this is contributing to your pancreatitis/abdominal pain.  If you need help stopping, you need to check into a detox/rehabilitation center.  If you develop worsening, continued, or recurrent abdominal pain, uncontrolled vomiting, fever, chest or back pain, or any other new/concerning symptoms then return to the ER for evaluation.

## 2021-11-12 NOTE — ED Provider Triage Note (Signed)
Emergency Medicine Provider Triage Evaluation Note  Edward Ware , a 36 y.o. male  was evaluated in triage.  Pt complains of RUQ abdominal pain that radiates to the back..  Patient states symptoms started yesterday afternoon.  They have gradually worsened.  He has had pancreatitis seen due to alcohol use.  He was supposed to have quit alcohol after this episode, however has continued to drink.  He says the symptoms are similar to pancreatitis.  He denies any nausea, vomiting, diarrhea, or constipation.  Denies chest pain or shortness of breath.  He does note that he had elevated blood sugar up to 600 yesterday, and 300 this morning.  He does have a history of DKA with type 2 diabetes.  Review of Systems  Positive: Abdominal pain, back pain Negative:   Physical Exam  BP (!) 160/103 (BP Location: Right Arm)    Pulse 82    Temp 98.1 F (36.7 C) (Oral)    Resp 18    SpO2 95%  Gen:   Awake, no distress   Resp:  Normal effort  MSK:   Moves extremities without difficulty  Other:  Significant RUQ abdominal pain but some ttp present in epigastric, umbilical, RLQ region as well.   Medical Decision Making  Medically screening exam initiated at 2:22 PM.  Appropriate orders placed.  Demitrious A Raz was informed that the remainder of the evaluation will be completed by another provider, this initial triage assessment does not replace that evaluation, and the importance of remaining in the ED until their evaluation is complete.  Abdominal work up initiated as well as DKA labs assessment. Will give Percocet while in waiting room. Sugar noted to be 396. RUQ ultrasound to review gallbladder etiology of sx.   Claudie Leach, PA-C 11/12/21 1425

## 2021-11-12 NOTE — ED Triage Notes (Signed)
Pt c/o upper R quadrant abdominal pain starting yesterday.  Pts BS was 300 this morning. Patient states they have had pancreatitis in the past and this pain feels similar.  10/10 pain.

## 2021-11-17 ENCOUNTER — Encounter: Payer: Self-pay | Admitting: Nurse Practitioner

## 2021-11-17 ENCOUNTER — Ambulatory Visit (INDEPENDENT_AMBULATORY_CARE_PROVIDER_SITE_OTHER): Payer: 59 | Admitting: Nurse Practitioner

## 2021-11-17 ENCOUNTER — Other Ambulatory Visit: Payer: Self-pay

## 2021-11-17 VITALS — BP 132/80 | HR 96 | Temp 97.9°F | Ht 65.0 in | Wt 233.0 lb

## 2021-11-17 DIAGNOSIS — E1165 Type 2 diabetes mellitus with hyperglycemia: Secondary | ICD-10-CM

## 2021-11-17 DIAGNOSIS — R1901 Right upper quadrant abdominal swelling, mass and lump: Secondary | ICD-10-CM

## 2021-11-17 DIAGNOSIS — E669 Obesity, unspecified: Secondary | ICD-10-CM

## 2021-11-17 DIAGNOSIS — I10 Essential (primary) hypertension: Secondary | ICD-10-CM | POA: Diagnosis not present

## 2021-11-17 LAB — POCT URINALYSIS DIP (CLINITEK)
Bilirubin, UA: NEGATIVE
Blood, UA: NEGATIVE
Glucose, UA: >=1000 mg/dL — AB
Ketones, POC UA: NEGATIVE mg/dL
Leukocytes, UA: NEGATIVE
Nitrite, UA: NEGATIVE
POC PROTEIN,UA: NEGATIVE
Spec Grav, UA: 1.01 (ref 1.010–1.025)
Urobilinogen, UA: 0.2 E.U./dL
pH, UA: 5.5 (ref 5.0–8.0)

## 2021-11-17 LAB — POCT GLYCOSYLATED HEMOGLOBIN (HGB A1C)
HbA1c POC (<> result, manual entry): 9.9 % (ref 4.0–5.6)
HbA1c, POC (controlled diabetic range): 9.9 % — AB (ref 0.0–7.0)
HbA1c, POC (prediabetic range): 9.9 % — AB (ref 5.7–6.4)
Hemoglobin A1C: 9.9 % — AB (ref 4.0–5.6)

## 2021-11-17 LAB — GLUCOSE, POCT (MANUAL RESULT ENTRY): POC Glucose: 454 mg/dl — AB (ref 70–99)

## 2021-11-17 MED ORDER — NOVOLOG MIX 70/30 FLEXPEN (70-30) 100 UNIT/ML ~~LOC~~ SUPN: PEN_INJECTOR | SUBCUTANEOUS | 0 refills | Status: DC

## 2021-11-17 MED ORDER — OXYCODONE HCL 5 MG PO TABS: 5.0000 mg | ORAL_TABLET | ORAL | 0 refills | Status: DC | PRN

## 2021-11-17 MED ORDER — METFORMIN HCL 1000 MG PO TABS: 1000.0000 mg | ORAL_TABLET | Freq: Two times a day (BID) | ORAL | 3 refills | Status: DC

## 2021-11-17 NOTE — Progress Notes (Signed)
[facility] Patient [facility] [address] [place], [place]  [address] Phone:  [phone]   Fax:  [phone]   Established Patient Office Visit  Subjective:  Patient ID: Edward Ware, male    DOB: 06-30-1986  Age: 36 y.o. MRN: [id number]  CC:  Chief Complaint  Patient presents with   Follow-up    Pt is here today for his pancreas pains and elevated blood sugar levels. Pt states that his blood sugar has been ranging in the 400 to 500 range. Pt was seen in the ED on 11/12/21 and pain medications and fluids were given.    HPI [name] presents for follow up. He  has a past medical history of Diabetes mellitus without complication (HCC) and Hypertension.   Mr. [name] is in today for diabetes follow up. The prescribed treatment is insulin 20 unit BID and Metformin 500 mg.The reported use of treatment  is consistent with prescribed. There are no reported side effects from the treatment. Home glucose monitoring indicates a CBG range of  430  to  579. He has RUQ and right flank pain and nausea.  Denies fever, chills, headache, dizziness, visual changes, polydipsia,  polyphagia shortness of breath, dyspnea on exertion, chest pain, vomiting, polyuria, constipation, diarrhea,  any edema, numbness, tingling, burning of hand or feet. There has not been a professional eye exam in the year.  He has had recent  alcohol use.  He has a history of pancreatitis.   Past Medical History:  Diagnosis Date   Diabetes mellitus without complication (HCC)    Hypertension     History reviewed. No pertinent surgical history.  Family History  Problem Relation Age of Onset   Diabetes Mother    Hypertension Mother    Diabetes Other     Social History   Socioeconomic History   Marital status: Single    Spouse name: Not on file   Number of children: Not on file   Years of education: Not on file   Highest education level: Not on file  Occupational History   Not on file  Tobacco  Use   Smoking status: Former   Smokeless tobacco: Never  Vaping Use   Vaping Use: Every day   Substances: Nicotine, Flavoring  Substance and Sexual Activity   Alcohol use: Not Currently    Comment: 1 pint a night of alcohol   Drug use: No   Sexual activity: Yes  Other Topics Concern   Not on file  Social History Narrative   Not on file   Social Determinants of Health   Financial Resource Strain: Not on file  Food Insecurity: Not on file  Transportation Needs: Not on file  Physical Activity: Not on file  Stress: Not on file  Social Connections: Not on file  Intimate Partner Violence: Not on file    Outpatient Medications Prior to Visit  Medication Sig Dispense Refill   Blood Glucose Monitoring Suppl (TRUE METRIX METER) w/Device KIT use up to 4 times daily as directed 1 kit 0   doxylamine, Sleep, (UNISOM) 25 MG tablet Take 25 mg by mouth at bedtime as needed for sleep.     folic acid (FOLVITE) 1 MG tablet Take 1 tablet (1 mg total) by mouth daily. 90 tablet 3   glucose blood test strip use up to 4 times daily as directed. 100 each 0   Insulin Pen Needle 31G X 5 MM MISC use as directed 100 each 0  lisinopril (ZESTRIL) 10 MG tablet Take 1 tablet (10 mg total) by mouth daily. 90 tablet 3   thiamine 100 MG tablet Take 1 tablet (100 mg total) by mouth daily. 90 tablet 3   TRUEplus Lancets 28G MISC use up to 4 times daily as directed. 100 each 0   insulin aspart protamine - aspart (NOVOLOG MIX 70/30 FLEXPEN) (70-30) 100 UNIT/ML FlexPen Inject 15 Units into the skin 2 (two) times daily. 27 mL 3   metFORMIN (GLUCOPHAGE) 500 MG tablet Take 1 tablet (500 mg total) by mouth daily with breakfast. 90 tablet 3   oxyCODONE (ROXICODONE) 5 MG immediate release tablet Take 1 tablet (5 mg total) by mouth every 4 (four) hours as needed for severe pain. 10 tablet 0   benzonatate (TESSALON) 100 MG capsule Take 1 capsule (100 mg total) by mouth every 8 (eight) hours. (Patient not taking: Reported on  11/17/2021) 21 capsule 0   ibuprofen (ADVIL) 600 MG tablet Take 1 tablet (600 mg total) by mouth every 6 (six) hours as needed for mild pain. (Patient not taking: Reported on 11/17/2021) 30 tablet 0   loperamide (IMODIUM) 2 MG capsule Take 1 capsule (2 mg total) by mouth 4 (four) times daily as needed for diarrhea or loose stools. (Patient not taking: Reported on 11/17/2021) 12 capsule 0   promethazine-dextromethorphan (PROMETHAZINE-DM) 6.25-15 MG/5ML syrup Take 5 mLs by mouth 4 (four) times daily as needed for cough. (Patient not taking: Reported on 11/17/2021) 118 mL 0   No facility-administered medications prior to visit.    No Known Allergies  ROS Review of Systems    Objective:    Physical Exam Constitutional:      General: He is not in acute distress.    Appearance: He is obese. He is not ill-appearing, toxic-appearing or diaphoretic.  HENT:     Head: Normocephalic and atraumatic.     Nose: Nose normal.     Mouth/Throat:     Mouth: Mucous membranes are moist.  Cardiovascular:     Rate and Rhythm: Normal rate and regular rhythm.     Pulses: Normal pulses.     Heart sounds: Normal heart sounds.  Pulmonary:     Effort: Pulmonary effort is normal.     Breath sounds: Normal breath sounds.  Abdominal:     General: Bowel sounds are normal.     Palpations: Abdomen is soft. There is no mass.     Tenderness: There is abdominal tenderness. There is no right CVA tenderness.     Hernia: No hernia is present.     Comments: RUQ  Musculoskeletal:        General: Normal range of motion.     Cervical back: Normal range of motion.  Skin:    General: Skin is warm and dry.     Capillary Refill: Capillary refill takes less than 2 seconds.  Neurological:     General: No focal deficit present.     Mental Status: He is alert and oriented to person, place, and time.  Psychiatric:        Mood and Affect: Mood normal.        Thought Content: Thought content normal.        Judgment: Judgment  normal.    BP 132/80    Pulse 96    Temp 97.9 F (36.6 C)    Ht $R'5\' 5"'ip$  (1.651 m)    Wt 233 lb (105.7 kg)    SpO2 99%    BMI  38.77 kg/m  Wt Readings from Last 3 Encounters:  11/17/21 233 lb (105.7 kg)  07/09/21 217 lb 6.4 oz (98.6 kg)  06/21/21 243 lb (110.2 kg)     Health Maintenance Due  Topic Date Due   COVID-19 Vaccine (1) Never done   OPHTHALMOLOGY EXAM  Never done   Hepatitis C Screening  Never done   INFLUENZA VACCINE  Never done    There are no preventive care reminders to display for this patient.  Lab Results  Component Value Date   TSH 1.021 06/04/2021   Lab Results  Component Value Date   WBC 6.5 11/12/2021   HGB 15.7 11/12/2021   HCT 47.6 11/12/2021   MCV 90.2 11/12/2021   PLT 261 11/12/2021   Lab Results  Component Value Date   NA 131 (L) 11/12/2021   K 5.0 11/12/2021   CO2 25 11/12/2021   GLUCOSE 383 (H) 11/12/2021   BUN 14 11/12/2021   CREATININE 0.98 11/12/2021   BILITOT 0.7 11/12/2021   ALKPHOS 60 11/12/2021   AST 50 (H) 11/12/2021   ALT 69 (H) 11/12/2021   PROT 8.5 (H) 11/12/2021   ALBUMIN 4.7 11/12/2021   CALCIUM 9.7 11/12/2021   ANIONGAP 11 11/12/2021   Lab Results  Component Value Date   CHOL 142 06/12/2021   Lab Results  Component Value Date   HDL 21 (L) 06/12/2021   Lab Results  Component Value Date   LDLCALC 91 06/12/2021   Lab Results  Component Value Date   TRIG 151 (H) 06/12/2021   Lab Results  Component Value Date   CHOLHDL 6.8 06/12/2021   Lab Results  Component Value Date   HGBA1C 9.9 (A) 11/17/2021   HGBA1C 9.9 11/17/2021   HGBA1C 9.9 (A) 11/17/2021   HGBA1C 9.9 (A) 11/17/2021      Assessment & Plan:   Problem List Items Addressed This Visit       Cardiovascular and Mediastinum   Essential hypertension Stable Encouraged on going compliance with current medication regimen Encouraged home monitoring and recording BP <130/80 Eating a heart-healthy diet with less salt Encouraged regular physical  activity  Recommend Weight loss       Other   Obesity (BMI 30-39.9) Persistent Obesity with BMI and comorbidities as noted above.  Discussed proper diet (low fat, low sodium, high fiber) with patient.   Discussed need for regular exercise (3 times per week, 20 minutes per session) with patient.    Relevant Medications   insulin aspart protamine - aspart (NOVOLOG MIX 70/30 FLEXPEN) (70-30) 100 UNIT/ML FlexPen   metFORMIN (GLUCOPHAGE) 1000 MG tablet   Other Visit Diagnoses     Uncontrolled type 2 diabetes mellitus with hyperglycemia (HCC)    -  Primary Encourage compliance with current treatment regimen  Dose adjustment increased Lantus as prescribed Encourage regular CBG monitoring Encourage contacting office if excessive hyperglycemia and or hypoglycemia Lifestyle modification with healthy diet (fewer calories, more high fiber foods, whole grains and non-starchy vegetables, lower fat meat and fish, low-fat diary include healthy oils) regular exercise (physical activity) and weight loss Opthalmology exam discussed  Nutritional consult recommended Regular dental visits encouraged Home BP monitoring also encouraged goal <130/80    Relevant Medications   insulin aspart protamine - aspart (NOVOLOG MIX 70/30 FLEXPEN) (70-30) 100 UNIT/ML FlexPen   metFORMIN (GLUCOPHAGE) 1000 MG tablet   Other Relevant Orders   Glucose (CBG) (Completed)   HgB A1c (Completed)   POCT URINALYSIS DIP (CLINITEK) (Completed)   Comp. Metabolic  Panel (12)   RUQ abdominal mass     Oxycodone 5 mg #10.  Patient informed that he will not be receiving any additional pain medication.  He must make every effort to be compliant with his treatment .  Patient instructed that alcohol use and diabetes are contraindicated.       Meds ordered this encounter  Medications   insulin aspart protamine - aspart (NOVOLOG MIX 70/30 FLEXPEN) (70-30) 100 UNIT/ML FlexPen    Sig: Inject 25 Units into the skin 2 (two) times daily  for 7 days, THEN 30 Units 2 (two) times daily.    Dispense:  39.5 mL    Refill:  0    Order Specific Question:   Supervising Provider    Answer:   Tresa Garter [[id number]]   metFORMIN (GLUCOPHAGE) 1000 MG tablet    Sig: Take 1 tablet (1,000 mg total) by mouth 2 (two) times daily with a meal.    Dispense:  180 tablet    Refill:  3    Order Specific Question:   Supervising Provider    Answer:   Tresa Garter [[id number]]   oxyCODONE (ROXICODONE) 5 MG immediate release tablet    Sig: Take 1 tablet (5 mg total) by mouth every 4 (four) hours as needed for severe pain.    Dispense:  10 tablet    Refill:  0    Order Specific Question:   Supervising Provider    Answer:   Tresa Garter [id number]    Follow-up: Return in about 4 weeks (around 12/15/2021).    Vevelyn Francois, NP

## 2021-11-17 NOTE — Patient Instructions (Signed)

## 2021-11-18 ENCOUNTER — Encounter: Payer: Self-pay | Admitting: Nurse Practitioner

## 2021-11-18 LAB — COMP. METABOLIC PANEL (12)
AST: 61 IU/L — ABNORMAL HIGH (ref 0–40)
Albumin/Globulin Ratio: 1.6 (ref 1.2–2.2)
Albumin: 4.9 g/dL (ref 4.0–5.0)
Alkaline Phosphatase: 72 IU/L (ref 44–121)
BUN/Creatinine Ratio: 14 (ref 9–20)
BUN: 13 mg/dL (ref 6–20)
Bilirubin Total: <0.2 mg/dL (ref 0.0–1.2)
Calcium: 10.4 mg/dL — ABNORMAL HIGH (ref 8.7–10.2)
Chloride: 93 mmol/L — ABNORMAL LOW (ref 96–106)
Creatinine, Ser: 0.94 mg/dL (ref 0.76–1.27)
Globulin, Total: 3 g/dL (ref 1.5–4.5)
Glucose: 424 mg/dL — ABNORMAL HIGH (ref 70–99)
Potassium: 5.2 mmol/L (ref 3.5–5.2)
Sodium: 134 mmol/L (ref 134–144)
Total Protein: 7.9 g/dL (ref 6.0–8.5)
eGFR: 109 mL/min/{1.73_m2} (ref >59)

## 2021-12-15 ENCOUNTER — Ambulatory Visit: Payer: 59 | Admitting: Nurse Practitioner

## 2022-01-27 ENCOUNTER — Other Ambulatory Visit (HOSPITAL_COMMUNITY): Payer: Self-pay

## 2022-02-17 ENCOUNTER — Telehealth: Payer: Self-pay

## 2022-02-17 NOTE — Telephone Encounter (Signed)
Insulin ASPA mix 70/30 flexpen 75ml novolog mix 70/30 flxpen 3 ml blue inject 25 units into skin twice daily for 7 days then 30 units into the skin twice daily  ? ?Needles ?

## 2022-02-21 ENCOUNTER — Other Ambulatory Visit: Payer: Self-pay

## 2022-02-21 MED ORDER — NOVOLOG MIX 70/30 FLEXPEN (70-30) 100 UNIT/ML ~~LOC~~ SUPN: PEN_INJECTOR | SUBCUTANEOUS | 0 refills | Status: DC

## 2022-02-21 MED ORDER — INSULIN PEN NEEDLE 31G X 5 MM MISC: 0 refills | Status: AC

## 2022-06-14 ENCOUNTER — Other Ambulatory Visit: Payer: Self-pay | Admitting: Student

## 2022-06-14 DIAGNOSIS — K8521 Alcohol induced acute pancreatitis with uninfected necrosis: Secondary | ICD-10-CM

## 2022-06-15 ENCOUNTER — Other Ambulatory Visit: Payer: Self-pay | Admitting: Nurse Practitioner

## 2022-06-16 ENCOUNTER — Other Ambulatory Visit: Payer: Self-pay | Admitting: Student

## 2022-06-16 DIAGNOSIS — K8521 Alcohol induced acute pancreatitis with uninfected necrosis: Secondary | ICD-10-CM

## 2022-06-23 ENCOUNTER — Other Ambulatory Visit: Payer: Self-pay

## 2022-06-23 MED ORDER — NOVOLOG MIX 70/30 FLEXPEN (70-30) 100 UNIT/ML ~~LOC~~ SUPN: PEN_INJECTOR | SUBCUTANEOUS | 0 refills | Status: DC

## 2022-06-27 ENCOUNTER — Telehealth: Payer: Self-pay

## 2022-06-27 ENCOUNTER — Other Ambulatory Visit: Payer: Self-pay

## 2022-06-27 NOTE — Telephone Encounter (Signed)
Novolog Mix is requiring a prior auth.  Patient's ins or ins formulary has changed and requires trial and failure of Humalog Mix 75/25. If appropriate please consider changing therapy.

## 2022-08-21 IMAGING — CT CT ABD-PELV W/ CM
2 of 4 series · 16 of 46 positions shown, 18 images · IV contrast (omnipaque)
Comparison: Earlier today

CLINICAL DATA: Pancreatitis.

EXAM:
CT ABDOMEN AND PELVIS WITH CONTRAST
TECHNIQUE: Multidetector CT imaging of the abdomen and pelvis was performed
using the standard protocol following bolus administration of
intravenous contrast.
CONTRAST:  <See Chart> OMNIPAQUE IOHEXOL 350 MG/ML SOLN

[Series 2: axial st · axial · 0.83mm/px · z∈[+942,+1408]mm · 13 of 105 slices shown, 15 images]
[im 6/105  soft-tissue]
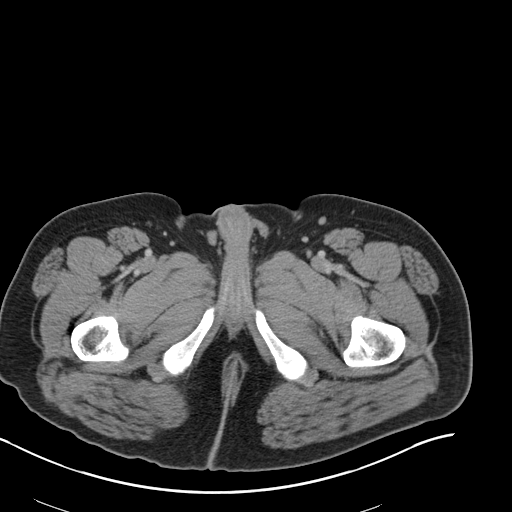
[im 6/105  bone]
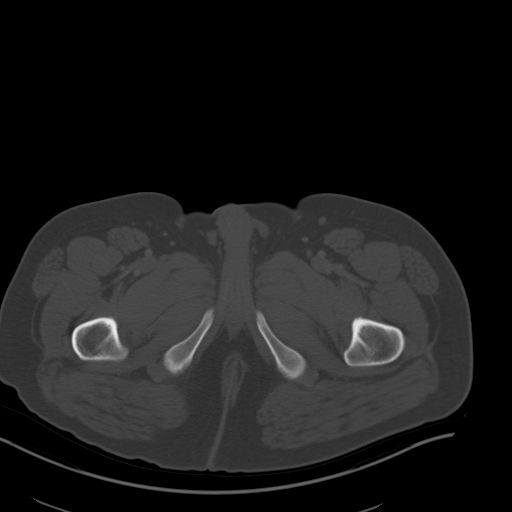
[im 12/105  soft-tissue]
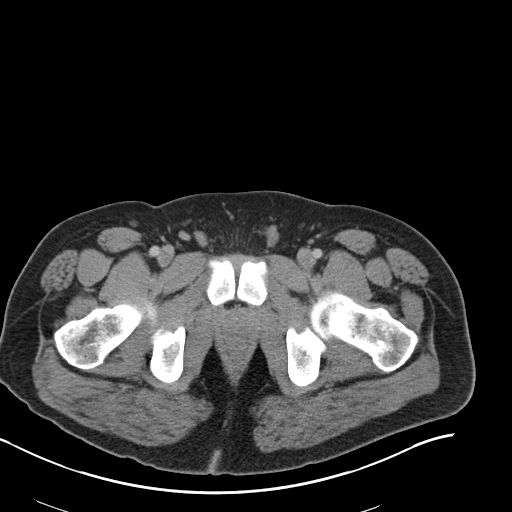
[im 24/105  soft-tissue]
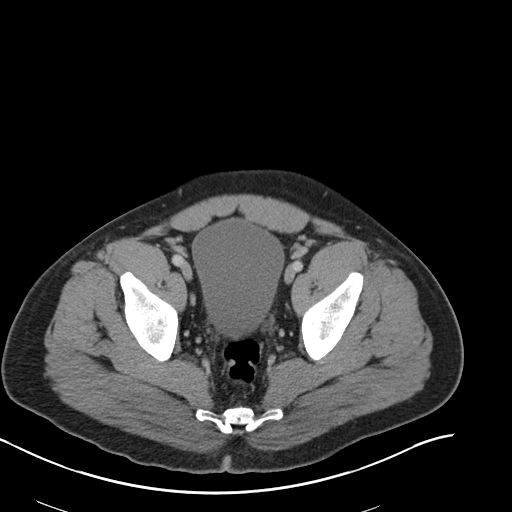
[im 29/105  soft-tissue]
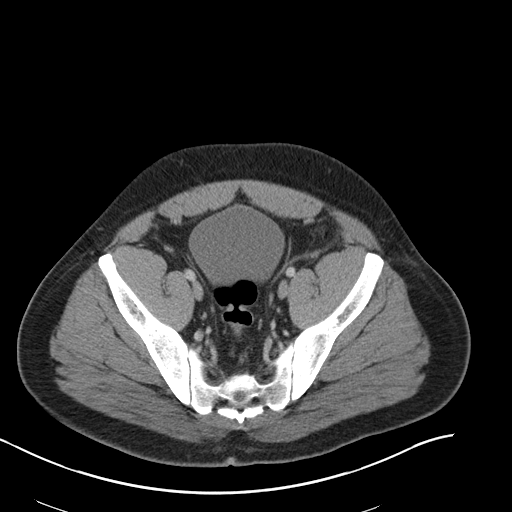
[im 35/105  soft-tissue]
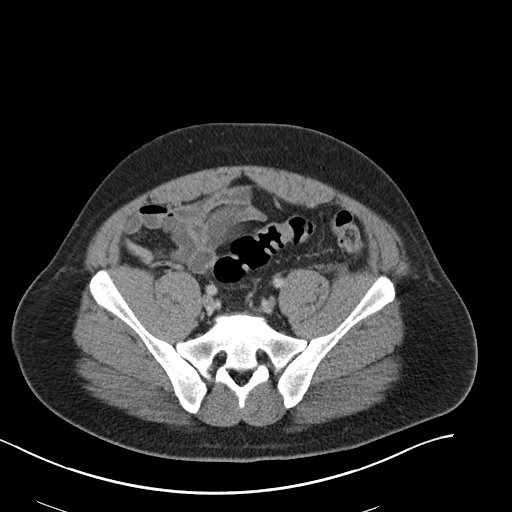
[im 47/105  soft-tissue]
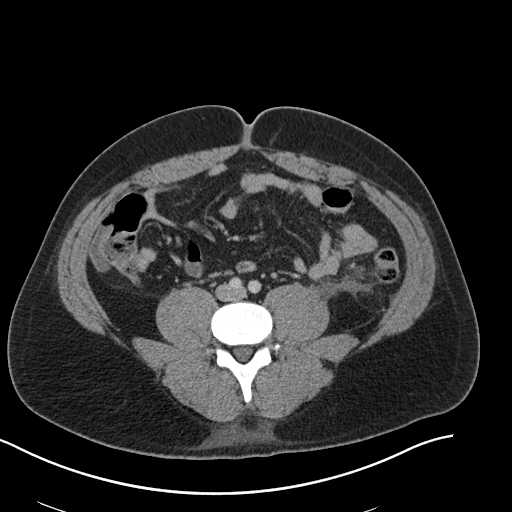
[im 53/105  soft-tissue]
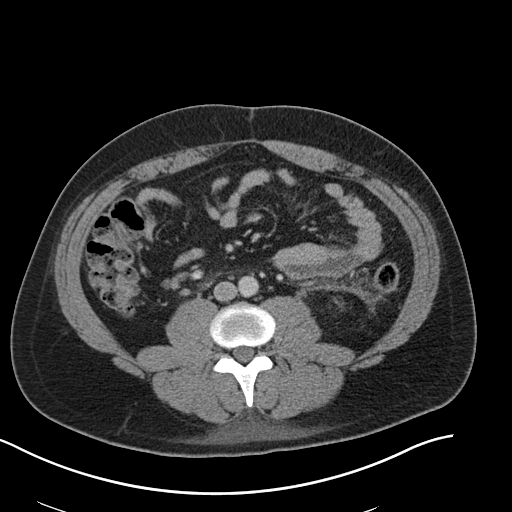
[im 58/105  soft-tissue]
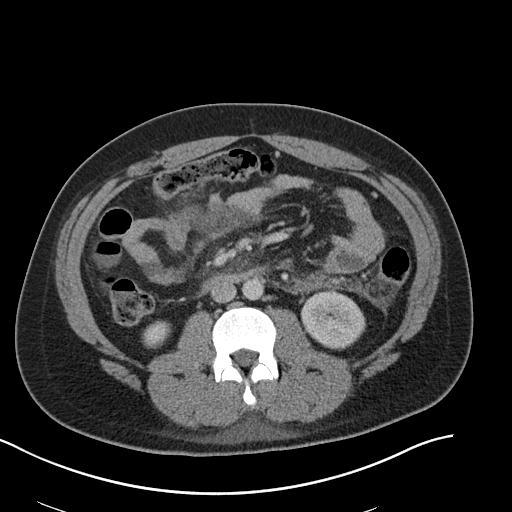
[im 70/105  soft-tissue]
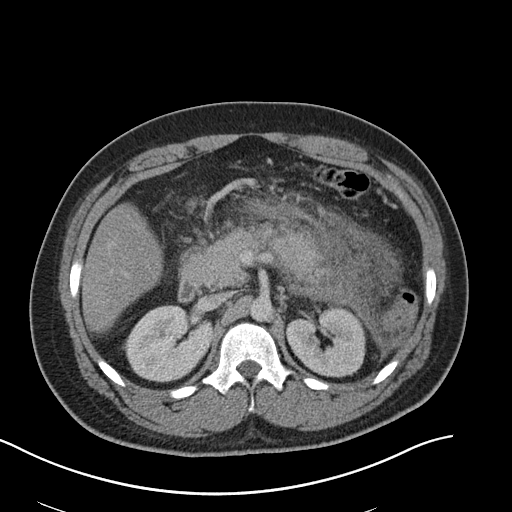
[im 70/105  bone]
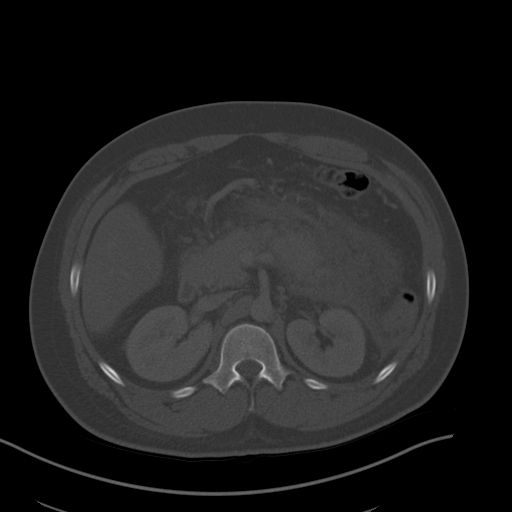
[im 76/105  soft-tissue]
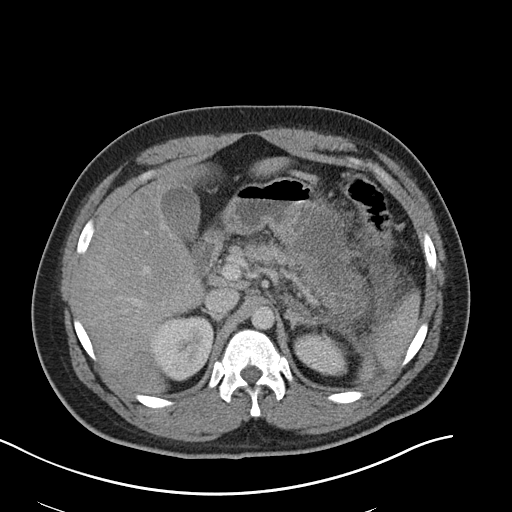
[im 81/105  soft-tissue]
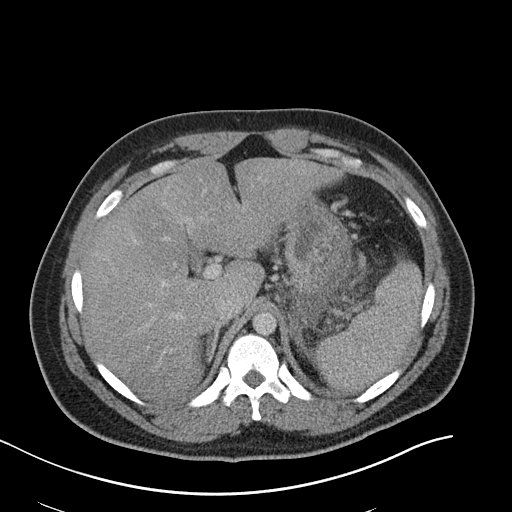
[im 93/105  soft-tissue]
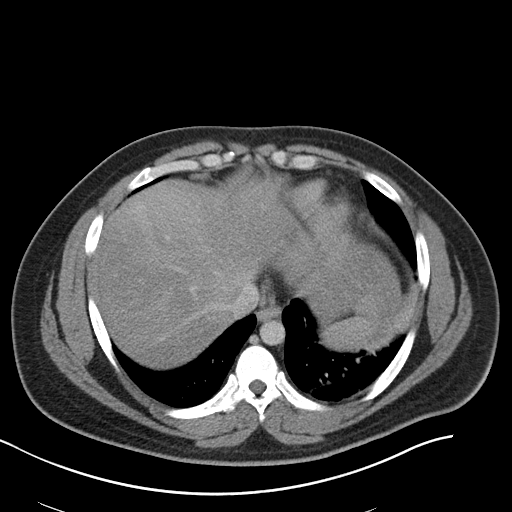
[im 99/105  soft-tissue]
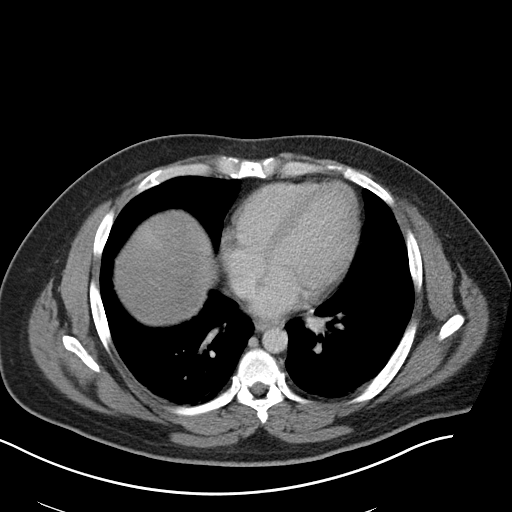

[Series 5: coronal st · coronal · 0.99mm/px · 3 of 165 slices shown]
[im 55/165  soft-tissue]
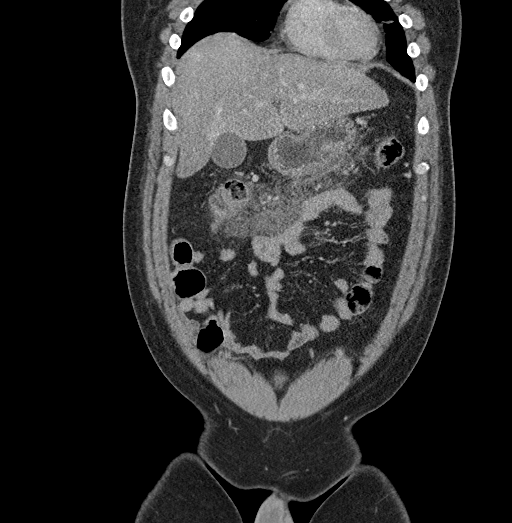
[im 73/165  soft-tissue]
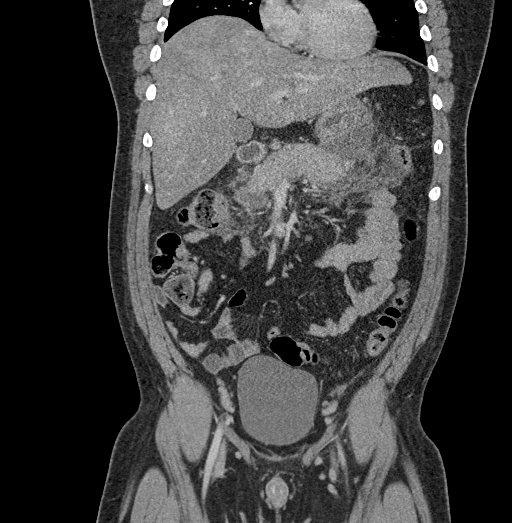
[im 92/165  soft-tissue]
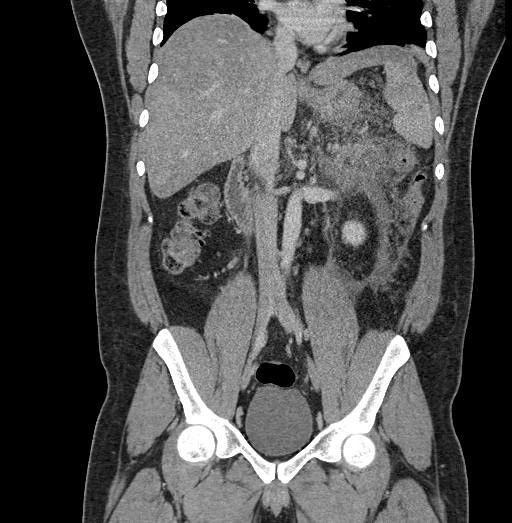

[16 of 46 positions shown; findings below may reference images not displayed]

FINDINGS: Lower chest: Subsegmental atelectasis noted in the left lung base.

Hepatobiliary: No focal liver abnormality. Hepatic steatosis noted.
Gallbladder normal. No bile duct dilatation.

Pancreas: Changes of acute pancreatitis identified as described
previously. This demonstrates significant progression when compared
with the previous exam. There is diminished enhancement of the
distal tail of pancreas which may reflect early necrosis. There is
extensive peripancreatic inflammatory fat stranding with free fluid
extending into the left retroperitoneum. Developing fluid collection
along the greater curvature of the stomach is identified measuring
8.4 x 4.0 x 2.4 cm. This does not have a well-defined enhancing rind
suggesting immature fluid collection/pseudocyst.

Spleen: Normal in size without focal abnormality.

Adrenals/Urinary Tract: Normal adrenal glands. No kidney mass or
hydronephrosis. Urinary bladder appears normal.

Stomach/Bowel: No bowel dilatation, wall thickening or inflammation
identified. The appendix is visualized and appears normal.

Vascular/Lymphatic: Normal caliber of the abdominal aorta. The
portal vein and portal venous confluence are patent. Thrombosis of
the splenic vein is identified. Increased left upper quadrant
collaterals compared with 06/03/2021.

Reproductive: Prostate is unremarkable.

Other: Free fluid is identified extending along left
retroperitoneum.

Musculoskeletal: No acute or significant osseous findings.
IMPRESSION: 1. Imaging findings compatible with acute pancreatitis.
Significantly increased from 06/03/2021. There is diminished
enhancement of the distal tail of pancreas which may reflect early
necrosis.
2. Developing fluid collection along the greater curvature of the
stomach is identified measuring 8.4 x 4.0 x 2.4 cm. This does not
have a well-defined enhancing rind suggesting immature fluid
collection/pseudocyst.
3. Thrombosis of the splenic vein with increased left upper quadrant
collaterals compared with 06/03/2021.
4. Hepatic steatosis.

## 2022-08-31 ENCOUNTER — Ambulatory Visit (INDEPENDENT_AMBULATORY_CARE_PROVIDER_SITE_OTHER): Payer: Self-pay | Admitting: Nurse Practitioner

## 2022-08-31 ENCOUNTER — Encounter: Payer: Self-pay | Admitting: Nurse Practitioner

## 2022-08-31 VITALS — BP 115/69 | HR 98 | Temp 99.0°F | Ht 66.0 in | Wt 210.0 lb

## 2022-08-31 DIAGNOSIS — E669 Obesity, unspecified: Secondary | ICD-10-CM

## 2022-08-31 DIAGNOSIS — I10 Essential (primary) hypertension: Secondary | ICD-10-CM

## 2022-08-31 DIAGNOSIS — F411 Generalized anxiety disorder: Secondary | ICD-10-CM

## 2022-08-31 DIAGNOSIS — E1165 Type 2 diabetes mellitus with hyperglycemia: Secondary | ICD-10-CM | POA: Insufficient documentation

## 2022-08-31 MED ORDER — HYDROXYZINE PAMOATE 25 MG PO CAPS: 25.0000 mg | ORAL_CAPSULE | Freq: Three times a day (TID) | ORAL | 1 refills | Status: AC | PRN

## 2022-08-31 MED ORDER — METFORMIN HCL 1000 MG PO TABS: 1000.0000 mg | ORAL_TABLET | Freq: Two times a day (BID) | ORAL | 3 refills | Status: AC

## 2022-08-31 MED ORDER — NOVOLOG MIX 70/30 FLEXPEN (70-30) 100 UNIT/ML ~~LOC~~ SUPN: PEN_INJECTOR | SUBCUTANEOUS | 0 refills | Status: DC

## 2022-08-31 NOTE — Assessment & Plan Note (Signed)
Last A1c was 15.8  3 months ago. He reports that he has been out of insulin for about a month Currently taking metformin 1000 mg twice daily Medications refilled today -Continue metformin 1000 mg twice daily - insulin aspart protamine - aspart (NOVOLOG MIX 70/30 FLEXPEN) (70-30) 100 UNIT/ML FlexPen; Inject 25 Units into the skin 2 (two) times daily for 7 days, THEN 30 Units 2 (two) times daily. (Patient not taking: Reported on 08/31/2022)  Dispense: 39.5 mL; Refill: 0 - Basic Metabolic Panel -Microalbumin creatinine ratio -A1C Diabetic foot exam completed ,patient referred to ophthalmologist for diabetic eye exam. Patient counseled on low-carb modified diet He was encouraged to check his blood sugar twice daily and call the less than 70. Need to get diabetes under control discussed with patient he verbalized understanding.  Follow-up in 3 months

## 2022-08-31 NOTE — Progress Notes (Signed)
New Patient Office Visit  Subjective:  Patient ID: Edward Ware, male    DOB: November 18, 1985  Age: 36 y.o. MRN: [id number]  CC:  Chief Complaint  Patient presents with   Follow-up    Diabetes     HPI Edward Ware is a 36 y.o. male with past medical history of uncontrolled type 2 diabetes, hypertension, acute pancreatitis, tobacco abuse, alcohol abuse, presents for follow-up for his chronic medical conditions.   Patient complains of generalized anxiety, he plans on turning himself in to the police for incarceration due to crimes that he has completed in the past. Like medications to help his anxiety he denies SI, HI.  He initially requested for referral for therapy but later declined.   Type 2 diabetes.  Patient stated that his pain metformin 1000 mg twice daily, stated that he ran out of his insulin about a month ago.  Patient denies polyphagia, polydipsia, polyuria, hypoglycemia.  Due for diabetic eye exam referral placed today.    Patient stated that he has stopped drinking alcohol since his last admission about 3 months ago for alcohol induced acute pancreatitis.  Patient encouraged to continue to avoid alcohol.  Stated that he has not used illicit drugs in the past but not currently  Past Medical History:  Diagnosis Date   Diabetes mellitus without complication ([place])    Hypertension     No past surgical history on file.  Family History  Problem Relation Age of Onset   Diabetes Mother    Hypertension Mother    Diabetes Other     Social History   Socioeconomic History   Marital status: Single    Spouse name: Not on file   Number of children: Not on file   Years of education: Not on file   Highest education level: Not on file  Occupational History   Not on file  Tobacco Use   Smoking status: Former   Smokeless tobacco: Never  Vaping Use   Vaping Use: Every day   Substances: Nicotine, Flavoring  Substance and Sexual Activity   Alcohol use: Not  Currently    Comment: 1 pint a night of alcohol   Drug use: Not Currently   Sexual activity: Yes  Other Topics Concern   Not on file  Social History Narrative   Not on file   Social Determinants of Health   Financial Resource Strain: Not on file  Food Insecurity: Not on file  Transportation Needs: Not on file  Physical Activity: Not on file  Stress: Not on file  Social Connections: Not on file  Intimate Partner Violence: Not on file    ROS Review of Systems  Constitutional:  Negative for activity change, appetite change, chills, diaphoresis, fatigue and fever.  Eyes:  Negative for pain, discharge and itching.  Respiratory: Negative.  Negative for apnea, cough, choking, chest tightness, shortness of breath, wheezing and stridor.   Cardiovascular: Negative.  Negative for chest pain, palpitations and leg swelling.  Gastrointestinal: Negative.  Negative for abdominal distention, abdominal pain and anal bleeding.  Endocrine: Negative for heat intolerance, polydipsia, polyphagia and polyuria.  Genitourinary:  Negative for difficulty urinating, dysuria and enuresis.  Musculoskeletal: Negative.   Neurological: Negative.  Negative for dizziness, facial asymmetry, light-headedness and headaches.  Psychiatric/Behavioral:  Negative for hallucinations, self-injury, sleep disturbance and suicidal ideas. The patient is nervous/anxious. The patient is not hyperactive.     Objective:   Today's Vitals: BP 115/69   Pulse 98   Temp  99 F (37.2 C)   Ht _0  (1.676 m)   Wt 210 lb (95.3 kg)   SpO2 99%   BMI 33.89 kg/m   Physical Exam Constitutional:      General: He is not in acute distress.    Appearance: He is obese. He is not ill-appearing, toxic-appearing or diaphoretic.  Eyes:     General: No scleral icterus.       Right eye: No discharge.        Left eye: No discharge.     Extraocular Movements: Extraocular movements intact.     Conjunctiva/sclera: Conjunctivae normal.      Pupils: Pupils are equal, round, and reactive to light.  Cardiovascular:     Rate and Rhythm: Normal rate and regular rhythm.     Pulses: Normal pulses.     Heart sounds: Normal heart sounds. No murmur heard.    No friction rub. No gallop.  Pulmonary:     Effort: Pulmonary effort is normal. No respiratory distress.     Breath sounds: Normal breath sounds. No stridor. No wheezing, rhonchi or rales.  Chest:     Chest wall: No tenderness.  Abdominal:     General: There is no distension.     Palpations: Abdomen is soft.     Tenderness: There is no abdominal tenderness. There is no right CVA tenderness, left CVA tenderness or guarding.  Musculoskeletal:        General: No swelling, tenderness, deformity or signs of injury.     Right lower leg: No edema.     Left lower leg: No edema.  Skin:    General: Skin is warm and dry.     Capillary Refill: Capillary refill takes less than 2 seconds.  Neurological:     Mental Status: He is alert and oriented to person, place, and time.     Cranial Nerves: No cranial nerve deficit.     Sensory: No sensory deficit.     Motor: No weakness.     Coordination: Coordination normal.     Gait: Gait normal.  Psychiatric:        Mood and Affect: Mood normal.        Behavior: Behavior normal.        Thought Content: Thought content normal.        Judgment: Judgment normal.     Assessment & Plan:   Problem List Items Addressed This Visit       Cardiovascular and Mediastinum   Essential hypertension    BP Readings from Last 3 Encounters:  08/31/22 115/69  11/17/21 132/80  11/12/21 (!) 150/98  Chronic condition well-controlled on lisinopril 10 mg daily Current current medication DASH diet advised engage in regular moderate to vigorous and 150 minutes weekly BMP today. Follow-up in 3 months        Endocrine   Uncontrolled type 2 diabetes mellitus with hyperglycemia (HCC) - Primary    Last A1c was 15.8  3 months ago. He reports that he has  been out of insulin for about a month Currently taking metformin 1000 mg twice daily Medications refilled today -Continue metformin 1000 mg twice daily - insulin aspart protamine - aspart (NOVOLOG MIX 70/30 FLEXPEN) (70-30) 100 UNIT/ML FlexPen; Inject 25 Units into the skin 2 (two) times daily for 7 days, THEN 30 Units 2 (two) times daily. (Patient not taking: Reported on 08/31/2022)  Dispense: 39.5 mL; Refill: 0 - Basic Metabolic Panel -Microalbumin creatinine ratio -A1C Diabetic foot exam completed ,  patient referred to ophthalmologist for diabetic eye exam. Patient counseled on low-carb modified diet He was encouraged to check his blood sugar twice daily and call the less than 70. Need to get diabetes under control discussed with patient he verbalized understanding.  Follow-up in 3 months      Relevant Medications   insulin aspart protamine - aspart (NOVOLOG MIX 70/30 FLEXPEN) (70-30) 100 UNIT/ML FlexPen   metFORMIN (GLUCOPHAGE) 1000 MG tablet   Other Relevant Orders   Hemoglobin A1c   Microalbumin/Creatinine Ratio, Urine   Ambulatory referral to Ophthalmology   Basic Metabolic Panel     Other   Obesity (BMI 30.0-34.9)    Wt Readings from Last 3 Encounters:  08/31/22 210 lb (95.3 kg)  11/17/21 233 lb (105.7 kg)  07/09/21 217 lb 6.4 oz (98.6 kg)  Patient counseled on low-carb modified diet He was encouraged to engage in regular moderate to vigorous exercises at least 150 minutes weekly Benefits of healthy weight discussed      GAD (generalized anxiety disorder)       08/31/2022    4:04 PM  GAD 7 : Generalized Anxiety Score  Nervous, Anxious, on Edge 3  Control/stop worrying 3  Worry too much - different things 3  Trouble relaxing 3  Restless 3  Easily annoyed or irritable 3  Afraid - awful might happen 3  Total GAD 7 Score 21  Anxiety Difficulty Extremely difficult  Anxiety related to the crimes he has committed in the past Start hydroxyzine 25 mg every 8 hours as  needed Patient refused referral for therapy. Follow-up in 3 months      Relevant Medications   hydrOXYzine (VISTARIL) 25 MG capsule    Outpatient Encounter Medications as of 08/31/2022  Medication Sig   Blood Glucose Monitoring Suppl (TRUE METRIX METER) w/Device KIT use up to 4 times daily as directed   doxylamine, Sleep, (UNISOM) 25 MG tablet Take 25 mg by mouth at bedtime as needed for sleep.   folic acid (FOLVITE) 1 MG tablet Take 1 mg by mouth daily.   glucose blood test strip use up to 4 times daily as directed.   hydrOXYzine (VISTARIL) 25 MG capsule Take 1 capsule (25 mg total) by mouth every 8 (eight) hours as needed.   Insulin Pen Needle 31G X 5 MM MISC use as directed   lisinopril (ZESTRIL) 10 MG tablet Take 1 tablet (10 mg total) by mouth daily.   TRUEplus Lancets 28G MISC use up to 4 times daily as directed.   [DISCONTINUED] metFORMIN (GLUCOPHAGE) 1000 MG tablet Take 1 tablet (1,000 mg total) by mouth 2 (two) times daily with a meal.   [DISCONTINUED] oxyCODONE (ROXICODONE) 5 MG immediate release tablet Take 1 tablet (5 mg total) by mouth every 4 (four) hours as needed for severe pain.   ibuprofen (ADVIL) 600 MG tablet Take 1 tablet (600 mg total) by mouth every 6 (six) hours as needed for mild pain. (Patient not taking: Reported on 11/17/2021)   insulin aspart protamine - aspart (NOVOLOG MIX 70/30 FLEXPEN) (70-30) 100 UNIT/ML FlexPen Inject 25 Units into the skin 2 (two) times daily for 7 days, THEN 30 Units 2 (two) times daily. (Patient not taking: Reported on 08/31/2022)   metFORMIN (GLUCOPHAGE) 1000 MG tablet Take 1 tablet (1,000 mg total) by mouth 2 (two) times daily with a meal.   [DISCONTINUED] benzonatate (TESSALON) 100 MG capsule Take 1 capsule (100 mg total) by mouth every 8 (eight) hours. (Patient not taking: Reported on  11/17/2021)   [DISCONTINUED] insulin aspart protamine - aspart (NOVOLOG MIX 70/30 FLEXPEN) (70-30) 100 UNIT/ML FlexPen Inject 25 Units into the skin 2 (two)  times daily for 7 days, THEN 30 Units 2 (two) times daily. (Patient not taking: Reported on 08/31/2022)   [DISCONTINUED] loperamide (IMODIUM) 2 MG capsule Take 1 capsule (2 mg total) by mouth 4 (four) times daily as needed for diarrhea or loose stools. (Patient not taking: Reported on 11/17/2021)   [DISCONTINUED] promethazine-dextromethorphan (PROMETHAZINE-DM) 6.25-15 MG/5ML syrup Take 5 mLs by mouth 4 (four) times daily as needed for cough. (Patient not taking: Reported on 11/17/2021)   No facility-administered encounter medications on file as of 08/31/2022.    Follow-up: Return in about 3 months (around 12/01/2022) for DM/CPE.   Renee Rival, FNP

## 2022-08-31 NOTE — Assessment & Plan Note (Signed)
BP Readings from Last 3 Encounters:  08/31/22 115/69  11/17/21 132/80  11/12/21 (!) 150/98  Chronic condition well-controlled on lisinopril 10 mg daily Current current medication DASH diet advised engage in regular moderate to vigorous and 150 minutes weekly BMP today. Follow-up in 3 months

## 2022-08-31 NOTE — Assessment & Plan Note (Addendum)
Wt Readings from Last 3 Encounters:  08/31/22 210 lb (95.3 kg)  11/17/21 233 lb (105.7 kg)  07/09/21 217 lb 6.4 oz (98.6 kg)  Patient counseled on low-carb modified diet He was encouraged to engage in regular moderate to vigorous exercises at least 150 minutes weekly Benefits of healthy weight discussed

## 2022-08-31 NOTE — Assessment & Plan Note (Signed)
    08/31/2022    4:04 PM  GAD 7 : Generalized Anxiety Score  Nervous, Anxious, on Edge 3  Control/stop worrying 3  Worry too much - different things 3  Trouble relaxing 3  Restless 3  Easily annoyed or irritable 3  Afraid - awful might happen 3  Total GAD 7 Score 21  Anxiety Difficulty Extremely difficult   Anxiety related to the crimes he has committed in the past Start hydroxyzine 25 mg every 8 hours as needed Patient refused referral for therapy. Follow-up in 3 months

## 2022-08-31 NOTE — Patient Instructions (Addendum)
1. Uncontrolled type 2 diabetes mellitus with hyperglycemia (HCC)  - Hemoglobin A1c - Microalbumin/Creatinine Ratio, Urine - Ambulatory referral to Ophthalmology  insulin aspart protamine - aspart (NOVOLOG MIX 70/30 FLEXPEN) (70-30) 100 UNIT/ML FlexPen; Inject 25 Units into the skin 2 (two) times daily for 7 days, THEN 30 Units 2 (two) times daily.  Take metformin 1000mg  twice daily .    2. GAD (generalized anxiety disorder)  - hydrOXYzine (VISTARIL) 25 MG capsule; Take 1 capsule (25 mg total) by mouth every 8 (eight) hours as needed.  Dispense: 30 capsule; Refill: 1   .  It is important that you exercise regularly at least 30 minutes 5 times a week as tolerated  Think about what you will eat, plan ahead. Choose " clean, green, fresh or frozen" over canned, processed or packaged foods which are more sugary, salty and fatty. 70 to 75% of food eaten should be vegetables and fruit. Three meals at set times with snacks allowed between meals, but they must be fruit or vegetables. Aim to eat over a 12 hour period , example 7 am to 7 pm, and STOP after  your last meal of the day. Drink water,generally about 64 ounces per day, no other drink is as healthy. Fruit juice is best enjoyed in a healthy way, by EATING the fruit.  Thanks for choosing [facility] we consider it a privelige to serve you.

## 2022-09-01 LAB — BASIC METABOLIC PANEL
BUN/Creatinine Ratio: 10 (ref 9–20)
BUN: 10 mg/dL (ref 6–20)
CO2: 24 mmol/L (ref 20–29)
Calcium: 9.6 mg/dL (ref 8.7–10.2)
Chloride: 103 mmol/L (ref 96–106)
Creatinine, Ser: 1 mg/dL (ref 0.76–1.27)
Glucose: 167 mg/dL — ABNORMAL HIGH (ref 70–99)
Potassium: 4.9 mmol/L (ref 3.5–5.2)
Sodium: 139 mmol/L (ref 134–144)
eGFR: 101 mL/min/{1.73_m2} (ref >59)

## 2022-09-01 LAB — HEMOGLOBIN A1C
Est. average glucose Bld gHb Est-mCnc: 309 mg/dL
Hgb A1c MFr Bld: 12.4 % — ABNORMAL HIGH (ref 4.8–5.6)

## 2022-09-02 ENCOUNTER — Other Ambulatory Visit: Payer: Self-pay

## 2022-09-02 NOTE — Progress Notes (Signed)
Diabetes remains uncontrolled. Take current medications as ordered, avoid, sugar, sweets, soda, juice, high carb foods.

## 2022-09-04 LAB — MICROALBUMIN / CREATININE URINE RATIO
Creatinine, Urine: 326.5 mg/dL
Microalb/Creat Ratio: 8 mg/g creat (ref 0–29)
Microalbumin, Urine: 27.1 ug/mL

## 2022-09-05 ENCOUNTER — Other Ambulatory Visit: Payer: Self-pay

## 2022-09-05 ENCOUNTER — Other Ambulatory Visit: Payer: Self-pay | Admitting: Nurse Practitioner

## 2022-09-05 MED ORDER — INSULIN LISPRO PROT & LISPRO (75-25 MIX) 100 UNIT/ML KWIKPEN
PEN_INJECTOR | SUBCUTANEOUS | 11 refills | Status: AC
  Filled 2022-09-05: qty 15, 25d supply, fill #0

## 2022-09-05 MED ORDER — INSULIN LISPRO PROT & LISPRO (75-25 MIX) 100 UNIT/ML KWIKPEN: PEN_INJECTOR | SUBCUTANEOUS | 11 refills | Status: DC

## 2022-09-06 ENCOUNTER — Other Ambulatory Visit: Payer: Self-pay

## 2022-09-06 NOTE — Progress Notes (Signed)
Pt was advised of script sent to the pharmacy.

## 2022-09-27 ENCOUNTER — Telehealth: Payer: Self-pay

## 2022-09-27 NOTE — Telephone Encounter (Signed)
Transition Care Management Unsuccessful Follow-up Telephone Call  Date of discharge and from where:  09/26/22  Attempts:  1st Attempt  Reason for unsuccessful TCM follow-up call:  Unable to leave message  Saydi Kobel RMA     

## 2022-09-30 ENCOUNTER — Telehealth: Payer: Self-pay

## 2022-09-30 NOTE — Telephone Encounter (Signed)
Transition Care Management Unsuccessful Follow-up Telephone Call  Date of discharge and from where:  09/29/22 and 09/26/22  Attempts:  2nd Attempt  Reason for unsuccessful TCM follow-up call:  Unable to reach patient  Renelda Loma RMA

## 2022-10-24 ENCOUNTER — Other Ambulatory Visit: Payer: Self-pay

## 2022-10-24 ENCOUNTER — Encounter (HOSPITAL_COMMUNITY): Payer: Self-pay

## 2022-10-24 ENCOUNTER — Emergency Department (HOSPITAL_COMMUNITY)
Admission: EM | Admit: 2022-10-24 | Discharge: 2022-10-24 | Disposition: A | Discharge status: Home / Self Care | Payer: Medicaid Other | Attending: Emergency Medicine | Admitting: Emergency Medicine

## 2022-10-24 ENCOUNTER — Telehealth: Payer: Self-pay

## 2022-10-24 DIAGNOSIS — R55 Syncope and collapse: Secondary | ICD-10-CM | POA: Diagnosis present

## 2022-10-24 DIAGNOSIS — Z7984 Long term (current) use of oral hypoglycemic drugs: Secondary | ICD-10-CM | POA: Diagnosis not present

## 2022-10-24 DIAGNOSIS — E1165 Type 2 diabetes mellitus with hyperglycemia: Secondary | ICD-10-CM | POA: Insufficient documentation

## 2022-10-24 DIAGNOSIS — Z794 Long term (current) use of insulin: Secondary | ICD-10-CM | POA: Diagnosis not present

## 2022-10-24 HISTORY — DX: Acute pancreatitis without necrosis or infection, unspecified: K85.90

## 2022-10-24 LAB — CBC
HCT: 49.4 % (ref 39.0–52.0)
Hemoglobin: 16.5 g/dL (ref 13.0–17.0)
MCH: 29.8 pg (ref 26.0–34.0)
MCHC: 33.4 g/dL (ref 30.0–36.0)
MCV: 89.3 fL (ref 80.0–100.0)
Platelets: 263 10*3/uL (ref 150–400)
RBC: 5.53 MIL/uL (ref 4.22–5.81)
RDW: 14.1 % (ref 11.5–15.5)
WBC: 6 10*3/uL (ref 4.0–10.5)
nRBC: 0 % (ref 0.0–0.2)

## 2022-10-24 LAB — COMPREHENSIVE METABOLIC PANEL WITH GFR
ALT: 18 U/L (ref 0–44)
AST: 17 U/L (ref 15–41)
Albumin: 4 g/dL (ref 3.5–5.0)
Alkaline Phosphatase: 68 U/L (ref 38–126)
Anion gap: 13 (ref 5–15)
BUN: 10 mg/dL (ref 6–20)
CO2: 20 mmol/L — ABNORMAL LOW (ref 22–32)
Calcium: 9.1 mg/dL (ref 8.9–10.3)
Chloride: 99 mmol/L (ref 98–111)
Creatinine, Ser: 0.76 mg/dL (ref 0.61–1.24)
GFR, Estimated: >60 mL/min
Glucose, Bld: 351 mg/dL — ABNORMAL HIGH (ref 70–99)
Potassium: 4.4 mmol/L (ref 3.5–5.1)
Sodium: 132 mmol/L — ABNORMAL LOW (ref 135–145)
Total Bilirubin: 0.6 mg/dL (ref 0.3–1.2)
Total Protein: 7.7 g/dL (ref 6.5–8.1)

## 2022-10-24 LAB — URINALYSIS, ROUTINE W REFLEX MICROSCOPIC
Bacteria, UA: NONE SEEN
Bilirubin Urine: NEGATIVE
Glucose, UA: >=500 mg/dL — AB
Hgb urine dipstick: NEGATIVE
Ketones, ur: 20 mg/dL — AB
Leukocytes,Ua: NEGATIVE
Nitrite: NEGATIVE
Protein, ur: NEGATIVE mg/dL
Specific Gravity, Urine: 1.029 (ref 1.005–1.030)
pH: 6 (ref 5.0–8.0)

## 2022-10-24 LAB — TROPONIN I (HIGH SENSITIVITY)
Troponin I (High Sensitivity): 4 ng/L
Troponin I (High Sensitivity): 4 ng/L (ref <18)

## 2022-10-24 LAB — CBG MONITORING, ED
Glucose-Capillary: 369 mg/dL — ABNORMAL HIGH (ref 70–99)
Glucose-Capillary: 267 mg/dL — ABNORMAL HIGH (ref 70–99)

## 2022-10-24 LAB — BLOOD GAS, VENOUS
Acid-base deficit: 1.4 mmol/L (ref 0.0–2.0)
Bicarbonate: 22.8 mmol/L (ref 20.0–28.0)
O2 Saturation: 85.4 %
Patient temperature: 36.1
pCO2, Ven: 35 mmHg — ABNORMAL LOW (ref 44–60)
pH, Ven: 7.42 (ref 7.25–7.43)
pO2, Ven: 49 mmHg — ABNORMAL HIGH (ref 32–45)

## 2022-10-24 MED ORDER — INSULIN ASPART 100 UNIT/ML IJ SOLN
10.0000 [IU] | Freq: Once | INTRAMUSCULAR | Status: AC
  Administered 2022-10-24: 10 [IU] via INTRAVENOUS
  Filled 2022-10-24: qty 0.1

## 2022-10-24 MED ORDER — LACTATED RINGERS IV BOLUS
1000.0000 mL | Freq: Once | INTRAVENOUS | Status: AC
  Administered 2022-10-24: 1000 mL via INTRAVENOUS

## 2022-10-24 NOTE — Discharge Instructions (Signed)
You were evaluated here in the department for passing out.  This is known as syncope. You were evaluated with heart tracing which is normal.  Heart enzyme which is normal.  The remainder of your labs were significant only for high blood sugar. You were treated here with fluids and insulin. No definite cause of passing out was found. Please call the number on your discharge papers as Discussed for outpatient follow-up and treatment. Turn to the emergency department if you are having worsening symptoms at any time.

## 2022-10-24 NOTE — ED Provider Notes (Signed)
[facility]-EMERGENCY DEPT Provider Note   CSN: [id number] Arrival date & time: 10/24/22  4481     History {Add pertinent medical, surgical, social history, OB history to HPI:1} Chief Complaint  Patient presents with   Loss of Consciousness    Edward Ware is a 37 y.o. male.  HPI 37 year old male history of diabetes, seen yesterday at [facility] for hyperglycemia, presents this morning complaining of syncopal episode.  Friend is present with him.  He states he got up this morning and took a shower.  He was in the kitchen talking to his friend when his eyes began to roll back in his head and he started to fall.  The friend caught him and was able to walk him to the couch where he was completely unconscious for several minutes.  He called 911.  He states that the patient did not appear to be breathing well for several minutes and then can breathing and was somewhat confused.  It took him several minutes to get back to his normal self.  He had not taken his blood sugar prior to this episode.  He had used his 20 units of his insulin.  He had not eaten.  He has no history of seizure disorder in the past.  He states this episode has happened once before.  He has had ongoing episodes of sharp chest pain.  He states that this been ongoing for some time.  He has not noted an increase in the frequency.  He does sometimes get some sweating with these episodes.  Patient needs to be in court this morning and needs a note for court.    Home Medications Prior to Admission medications   Medication Sig Start Date End Date Taking? Authorizing Provider  Blood Glucose Monitoring Suppl (TRUE METRIX METER) w/Device KIT use up to 4 times daily as directed 06/08/21   Meredeth Ide, MD  doxylamine, Sleep, (UNISOM) 25 MG tablet Take 25 mg by mouth at bedtime as needed for sleep.    [provider]  folic acid (FOLVITE) 1 MG tablet Take 1 mg by mouth daily. 08/14/22   [provider]  glucose blood test strip use up to 4 times daily as directed. 06/08/21   Meredeth Ide, MD  hydrOXYzine (VISTARIL) 25 MG capsule Take 1 capsule (25 mg total) by mouth every 8 (eight) hours as needed. 08/31/22   Donell Beers, FNP  ibuprofen (ADVIL) 600 MG tablet Take 1 tablet (600 mg total) by mouth every 6 (six) hours as needed for mild pain. Patient not taking: Reported on 11/17/2021 06/23/21   Joseph Art, DO  Insulin Lispro Prot & Lispro (HUMALOG MIX 75/25 KWIKPEN) (75-25) 100 UNIT/ML Kwikpen INJECT 25 units twice daily for 7 days starting 09/05/2022 , then 30 units twice daily starting on 09/13/2022 09/05/22   Donell Beers, FNP  Insulin Pen Needle 31G X 5 MM MISC use as directed 02/21/22   Ivonne Andrew, NP  lisinopril (ZESTRIL) 10 MG tablet Take 1 tablet (10 mg total) by mouth daily. 07/09/21 08/31/22  Barbette Merino, NP  metFORMIN (GLUCOPHAGE) 1000 MG tablet Take 1 tablet (1,000 mg total) by mouth 2 (two) times daily with a meal. 08/31/22   Paseda, Baird Kay, FNP  TRUEplus Lancets 28G MISC use up to 4 times daily as directed. 06/08/21   Meredeth Ide, MD      Allergies    Patient has no known allergies.  Review of Systems   Review of Systems  Physical Exam Updated Vital Signs BP 110/68 (BP Location: Left Arm)   Pulse 96   Temp 98.2 F (36.8 C) (Oral)   Resp 16   Ht 1.676 m (5\' 6" )   Wt 90.7 kg   SpO2 100%   BMI 32.28 kg/m  Physical Exam Vitals and nursing note reviewed.  Constitutional:      General: He is not in acute distress.    Appearance: Normal appearance. He is not ill-appearing.  HENT:     Head: Normocephalic and atraumatic.     Right Ear: External ear normal.     Left Ear: External ear normal.     Nose: Nose normal.     Mouth/Throat:     Pharynx: Oropharynx is clear.  Eyes:     Extraocular Movements: Extraocular movements intact.     Pupils: Pupils are equal, round, and reactive to light.  Cardiovascular:     Rate and  Rhythm: Normal rate and regular rhythm.     Pulses: Normal pulses.  Pulmonary:     Effort: Pulmonary effort is normal.     Breath sounds: Normal breath sounds.  Abdominal:     General: Abdomen is flat.     Palpations: Abdomen is soft.  Musculoskeletal:        General: Normal range of motion.     Cervical back: Normal range of motion.  Skin:    General: Skin is warm and dry.     Capillary Refill: Capillary refill takes less than 2 seconds.  Neurological:     General: No focal deficit present.     Mental Status: He is alert and oriented to person, place, and time.  Psychiatric:        Mood and Affect: Mood normal.     ED Results / Procedures / Treatments   Labs (all labs ordered are listed, but only abnormal results are displayed) Labs Reviewed  CBG MONITORING, ED - Abnormal; Notable for the following components:      Result Value   Glucose-Capillary 369 (*)    All other components within normal limits  CBC  URINALYSIS, ROUTINE W REFLEX MICROSCOPIC  COMPREHENSIVE METABOLIC PANEL  BLOOD GAS, VENOUS  CBG MONITORING, ED  TROPONIN I (HIGH SENSITIVITY)    EKG EKG Interpretation  Date/Time:  Monday October 24 2022 09:53:15 EST Ventricular Rate:  92 PR Interval:  130 QRS Duration: 86 QT Interval:  367 QTC Calculation: 875 R Axis:   65 Text Interpretation: Sinus rhythm Probable left atrial enlargement t wave inversion has resolved since first prior ekg of 03 June 2021 Confirmed by Pattricia Boss [phone]) on 10/24/2022 10:03:41 AM  Radiology No results found.  Procedures Procedures  {Document cardiac monitor, telemetry assessment procedure when appropriate:1}  Medications Ordered in ED Medications  lactated ringers bolus 1,000 mL (has no administration in time range)    ED Course/ Medical Decision Making/ A&P Clinical Course as of 10/24/22 1004  Mon Oct 24, 2022  1002 CBG 369 and is elevated [DR]    Clinical Course User Index [DR] Pattricia Boss, MD                            Medical Decision Making Amount and/or Complexity of Data Reviewed Labs: ordered.  Risk Prescription drug management.   This patient presents to the ED for concern of syncope, this involves an extensive number of treatment options, and is  a complaint that carries with it a high risk of complications and morbidity.  The differential diagnosis includes differential diagnosis is broad and includes but is not limited to 1 hypoglycemia 2 cardiac event including arrhythmia 3 seizure or other CNS events Orthostatic hypotension with volume depletion or autonomic dysregulation Simple faint   Co morbidities that complicate the patient evaluation  Insulin-dependent diabetes   Additional history obtained:  Additional history obtained from visit to [facility] ED yesterda External records from outside source obtained and reviewed including Patient at [facility] yesterday with co hyperglycemia x 2 days with increased urination, and thirst.He reported some beer intake and that he had a ho dka.  He complained of some dyspnea but no chest pain some epigastric pain CBC yesterday normal Urine ketones negative Ph 7.34 [id number] then 364 Beta hydroxybutyric acid normal Lipase normal EKG tachycardia with t wave abnormality inferior leads- by read EKG not viewed from [facility] Treated with fluids and insulin- improved and d/c'd in imporved condition   Lab Tests:  I Ordered, and personally interpreted labs.  The pertinent results include: CBC c-Met VBG ordered   I Cardiac Monitoring: / EKG:  The patient was maintained on a cardiac monitor.  I personally viewed and interpreted the cardiac monitored which showed an underlying rhythm of: EKG reviewed and interpreted with sinus rhythm noted without acute ischemic changes Reviewed first prior EKG that showed T wave inversion which has since resolved Patient maintained on monitor and has been in normal sinus rhythm with normal blood pressure, normal  oxygen saturations.   Problem List / ED Course / Critical interventions / Medication management  Syncope Hyperglycemia I ordered medication including syncope for and hyperglycemia Patient received IV fluids and insulin Reevaluation of the patient after these medicines showed that the patient improved I have reviewed the patients home medicines and have made adjustments as needed   Social Determinants of Health:  Patient with poor access to medical care   Test / Admission - Considered:  Patient maintained on monitor and considered hospitalization for arrhythmia and hyperglycemia Patient without any evidence of arrhythmia here in ED and appears stable for discharge Remainder of patient's workup shows normal hemoglobin, no evidence of electrolyte abnormality and no evidence of hypoglycemia here in the ED.   {Document critical care time when appropriate:1} {Document review of labs and clinical decision tools ie heart score, Chads2Vasc2 etc:1}  {Document your independent review of radiology images, and any outside records:1} {Document your discussion with family members, caretakers, and with consultants:1} {Document social determinants of health affecting pt's care:1} {Document your decision making why or why not admission, treatments were needed:1} Final Clinical Impression(s) / ED Diagnoses Final diagnoses:  None    Rx / DC Orders ED Discharge Orders     None

## 2022-10-24 NOTE — ED Notes (Signed)
Patient reports he has been diabetic for 1 year.  He admits to needing more guidance on how to manage his diabetes.  He denies any trauma from his syncopal episode.  He states his friend caught him.  Patient is alert and oriented.  Cap refill is less than 3 seconds.  No edema.  Lungs are clear on exam.  Patient admits to having a lot of stress recently.

## 2022-10-24 NOTE — ED Triage Notes (Signed)
Per EMS- patient is from home. Patient had a syncopal episode while sitting at his home. No injury. Patient reported to EMS that he went to a [facility] yesterday for hyperglycemia and was discharged. Patient reported that he has not been taking his medication x 2 weeks because it makes him feel worse.

## 2022-10-24 NOTE — Telephone Encounter (Signed)
Transition Care Management Unsuccessful Follow-up Telephone Call  Date of discharge and from where:  10/23/21  Attempts:  1st Attempt  Reason for unsuccessful TCM follow-up call:  Unable to reach patient.  Elyse Jarvis RMA

## 2022-10-25 ENCOUNTER — Telehealth: Payer: Self-pay

## 2022-10-25 NOTE — Telephone Encounter (Signed)
Transition Care Management Unsuccessful Follow-up Telephone Call  Date of discharge and from where:  10/24/22  Attempts:  2nd Attempt  Reason for unsuccessful TCM follow-up call:  Unable to reach patient.  Elyse Jarvis RMA

## 2022-12-02 ENCOUNTER — Ambulatory Visit: Payer: Self-pay | Admitting: Nurse Practitioner
